# Patient Record
Sex: Male | Born: 1960 | Race: White | Hispanic: No | Smoking: Never smoker
Health system: Southern US, Community
[De-identification: ages and names within clinical notes are randomized; demographics above are authoritative.]

## PROBLEM LIST (undated history)

## (undated) DIAGNOSIS — G43909 Migraine, unspecified, not intractable, without status migrainosus: Secondary | ICD-10-CM

## (undated) DIAGNOSIS — K648 Other hemorrhoids: Secondary | ICD-10-CM

## (undated) DIAGNOSIS — C61 Malignant neoplasm of prostate: Secondary | ICD-10-CM

## (undated) DIAGNOSIS — K317 Polyp of stomach and duodenum: Secondary | ICD-10-CM

## (undated) DIAGNOSIS — K219 Gastro-esophageal reflux disease without esophagitis: Secondary | ICD-10-CM

## (undated) DIAGNOSIS — K5792 Diverticulitis of intestine, part unspecified, without perforation or abscess without bleeding: Secondary | ICD-10-CM

## (undated) DIAGNOSIS — K2270 Barrett's esophagus without dysplasia: Secondary | ICD-10-CM

## (undated) DIAGNOSIS — N200 Calculus of kidney: Secondary | ICD-10-CM

## (undated) DIAGNOSIS — Z1159 Encounter for screening for other viral diseases: Secondary | ICD-10-CM

## (undated) DIAGNOSIS — F529 Unspecified sexual dysfunction not due to a substance or known physiological condition: Secondary | ICD-10-CM

## (undated) DIAGNOSIS — Z87442 Personal history of urinary calculi: Secondary | ICD-10-CM

## (undated) HISTORY — DX: Barrett's esophagus without dysplasia: K22.70

## (undated) HISTORY — DX: Encounter for screening for other viral diseases: Z11.59

## (undated) HISTORY — DX: Polyp of stomach and duodenum: K31.7

## (undated) HISTORY — DX: Diverticulitis of intestine, part unspecified, without perforation or abscess without bleeding: K57.92

## (undated) HISTORY — PX: APPENDECTOMY: SHX54

## (undated) HISTORY — DX: Gastro-esophageal reflux disease without esophagitis: K21.9

## (undated) HISTORY — PX: HERNIA REPAIR: SHX51

## (undated) HISTORY — PX: KIDNEY STONE SURGERY: SHX686

## (undated) HISTORY — PX: PROSTATE BIOPSY: SHX241

## (undated) HISTORY — PX: CHOLECYSTECTOMY: SHX55

## (undated) HISTORY — PX: COLECTOMY: SHX59

## (undated) HISTORY — DX: Migraine, unspecified, not intractable, without status migrainosus: G43.909

## (undated) HISTORY — DX: Unspecified sexual dysfunction not due to a substance or known physiological condition: F52.9

## (undated) HISTORY — DX: Other hemorrhoids: K64.8

---

## 2011-09-27 DIAGNOSIS — K648 Other hemorrhoids: Secondary | ICD-10-CM

## 2011-09-27 HISTORY — DX: Other hemorrhoids: K64.8

## 2012-09-26 DIAGNOSIS — K317 Polyp of stomach and duodenum: Secondary | ICD-10-CM

## 2012-09-26 DIAGNOSIS — K2270 Barrett's esophagus without dysplasia: Secondary | ICD-10-CM

## 2012-09-26 HISTORY — DX: Barrett's esophagus without dysplasia: K22.70

## 2012-09-26 HISTORY — DX: Polyp of stomach and duodenum: K31.7

## 2012-12-13 LAB — HM COLONOSCOPY: HM Colonoscopy: 2003

## 2013-02-13 ENCOUNTER — Other Ambulatory Visit: Payer: Self-pay | Admitting: Family Medicine

## 2013-02-13 ENCOUNTER — Other Ambulatory Visit: Payer: Self-pay | Admitting: *Deleted

## 2013-02-13 LAB — COMPLETE METABOLIC PANEL WITH GFR
ALT: 24 U/L (ref 0–53)
AST: 23 U/L (ref 0–37)
Albumin: 4.3 g/dL (ref 3.5–5.2)
Alkaline Phosphatase: 55 U/L (ref 39–117)
BUN: 11 mg/dL (ref 6–23)
CO2: 27 mEq/L (ref 19–32)
Calcium: 9.4 mg/dL (ref 8.4–10.5)
Chloride: 103 mEq/L (ref 96–112)
Creat: 1.09 mg/dL (ref 0.50–1.35)
GFR, Est African American: 89 mL/min
GFR, Est Non African American: 77 mL/min
Glucose, Bld: 85 mg/dL (ref 70–99)
Potassium: 4.3 mEq/L (ref 3.5–5.3)
Sodium: 140 mEq/L (ref 135–145)
Total Bilirubin: 0.7 mg/dL (ref 0.3–1.2)
Total Protein: 6.7 g/dL (ref 6.0–8.3)

## 2013-02-13 LAB — CBC WITH DIFFERENTIAL/PLATELET
Basophils Absolute: 0 10*3/uL (ref 0.0–0.1)
Basophils Relative: 1 % (ref 0–1)
Eosinophils Absolute: 0.4 10*3/uL (ref 0.0–0.7)
Eosinophils Relative: 6 % — ABNORMAL HIGH (ref 0–5)
HCT: 47.7 % (ref 39.0–52.0)
Hemoglobin: 16.8 g/dL (ref 13.0–17.0)
Lymphocytes Relative: 27 % (ref 12–46)
Lymphs Abs: 1.7 10*3/uL (ref 0.7–4.0)
MCH: 30 pg (ref 26.0–34.0)
MCHC: 35.2 g/dL (ref 30.0–36.0)
MCV: 85.2 fL (ref 78.0–100.0)
Monocytes Absolute: 0.8 10*3/uL (ref 0.1–1.0)
Monocytes Relative: 12 % (ref 3–12)
Neutro Abs: 3.5 10*3/uL (ref 1.7–7.7)
Neutrophils Relative %: 54 % (ref 43–77)
Platelets: 175 10*3/uL (ref 150–400)
RBC: 5.6 MIL/uL (ref 4.22–5.81)
RDW: 13.7 % (ref 11.5–15.5)
WBC: 6.4 10*3/uL (ref 4.0–10.5)

## 2013-02-13 LAB — LIPID PANEL
Cholesterol: 171 mg/dL (ref 0–200)
HDL: 34 mg/dL — ABNORMAL LOW (ref >39)
LDL Cholesterol: 100 mg/dL — ABNORMAL HIGH (ref 0–99)
Total CHOL/HDL Ratio: 5 Ratio
Triglycerides: 185 mg/dL — ABNORMAL HIGH (ref <150)
VLDL: 37 mg/dL (ref 0–40)

## 2013-02-14 LAB — VITAMIN D 25 HYDROXY (VIT D DEFICIENCY, FRACTURES): Vit D, 25-Hydroxy: 32 ng/mL (ref 30–89)

## 2013-02-22 ENCOUNTER — Ambulatory Visit (INDEPENDENT_AMBULATORY_CARE_PROVIDER_SITE_OTHER): Payer: BC Managed Care – PPO | Admitting: Family Medicine

## 2013-02-22 ENCOUNTER — Encounter: Payer: Self-pay | Admitting: *Deleted

## 2013-02-22 ENCOUNTER — Encounter: Payer: Self-pay | Admitting: Family Medicine

## 2013-02-22 VITALS — BP 122/76 | HR 71 | Wt 198.0 lb

## 2013-02-22 DIAGNOSIS — N529 Male erectile dysfunction, unspecified: Secondary | ICD-10-CM

## 2013-02-22 DIAGNOSIS — R5383 Other fatigue: Secondary | ICD-10-CM

## 2013-02-22 DIAGNOSIS — R5381 Other malaise: Secondary | ICD-10-CM

## 2013-02-22 DIAGNOSIS — G43909 Migraine, unspecified, not intractable, without status migrainosus: Secondary | ICD-10-CM

## 2013-02-22 DIAGNOSIS — Z125 Encounter for screening for malignant neoplasm of prostate: Secondary | ICD-10-CM

## 2013-02-22 DIAGNOSIS — K219 Gastro-esophageal reflux disease without esophagitis: Secondary | ICD-10-CM

## 2013-02-22 DIAGNOSIS — E291 Testicular hypofunction: Secondary | ICD-10-CM

## 2013-02-22 DIAGNOSIS — Z1159 Encounter for screening for other viral diseases: Secondary | ICD-10-CM

## 2013-02-22 MED ORDER — RIZATRIPTAN BENZOATE 10 MG PO TBDP: 10.0000 mg | ORAL_TABLET | ORAL | Status: DC | PRN

## 2013-02-22 MED ORDER — SILDENAFIL CITRATE 20 MG PO TABS: ORAL_TABLET | ORAL | Status: DC

## 2013-02-22 MED ORDER — OMEPRAZOLE 40 MG PO CPDR: 40.0000 mg | DELAYED_RELEASE_CAPSULE | Freq: Two times a day (BID) | ORAL | Status: DC

## 2013-02-22 MED ORDER — TADALAFIL 20 MG PO TABS: 20.0000 mg | ORAL_TABLET | Freq: Every day | ORAL | Status: DC | PRN

## 2013-02-26 LAB — PSA, TOTAL AND FREE
PSA, Free Pct: 15 % — ABNORMAL LOW (ref >25)
PSA, Free: 0.43 ng/mL
PSA: 2.94 ng/mL (ref <=4.00)

## 2013-02-26 LAB — TSH: TSH: 1.776 u[IU]/mL (ref 0.350–4.500)

## 2013-02-27 LAB — TESTOSTERONE, FREE, TOTAL, SHBG
Sex Hormone Binding: 20 nmol/L (ref 13–71)
Testosterone, Free: 91 pg/mL (ref 47.0–244.0)
Testosterone-% Free: 2.6 % (ref 1.6–2.9)
Testosterone: 352 ng/dL (ref 300–890)

## 2013-02-27 LAB — VARICELLA ZOSTER ANTIBODY, IGG: Varicella IgG: 2408 Index — ABNORMAL HIGH (ref <135.00)

## 2013-02-27 NOTE — Progress Notes (Signed)
  Subjective:    Patient ID: Phillip Gates, male    DOB: Jul 25, 1961, 52 y.o.   MRN: [id number]  HPI  Phillip Gates is here today to go over his most recent lab results and to discuss the conditions listed below:   1)  Migraine Headaches:  He continues to struggle with this problem.  He has tried Zomig in the past but does not feel that it works that well for him. He would like to try another medication.    2)  Sexual Dysfunction: He needs a refill on his ED medication.   3)  GERD:  He needs to have his omeprazole refilled.     Review of Systems  Constitutional: Positive for fatigue and unexpected weight change. Negative for activity change and appetite change.  HENT: Negative.   Eyes: Negative.   Respiratory: Negative for cough, chest tightness and shortness of breath.   Cardiovascular: Negative for chest pain, palpitations and leg swelling.  Gastrointestinal: Negative for abdominal pain, diarrhea, constipation and blood in stool.  Endocrine: Negative for polydipsia, polyphagia and polyuria.  Genitourinary: Negative for difficulty urinating.  Musculoskeletal: Negative for myalgias, joint swelling and arthralgias.  Skin: Negative for rash.  Neurological: Negative for dizziness, numbness and headaches.  Hematological: Negative.   Psychiatric/Behavioral: Negative.      Past Medical History  Diagnosis Date  . Migraine headache   . GERD (gastroesophageal reflux disease)   . Diverticulitis     Family History  Problem Relation Age of Onset  . Heart disease Father   . CVA Father   . Cancer Brother     History   Social History Narrative   Marital Status: Separated    Children: Son Phillip Gates) Daughter Phillip Gates)     Pets: None   Living Situation: Lives alone   Occupation: Product manager (Journalist, newspaper)    Education: Oncologist (Business)    Tobacco Use/Exposure:  None    Alcohol Use:  Ocassional    Drug Use:  None   Diet:  Regular   Exercise:  Weight Lifting/Cardio (5-6 X  per week)    Hobbies: Traveling                     Objective:   Physical Exam  Constitutional: He appears well-nourished.  HENT:  Head: Normocephalic.  Nose: Nose normal.  Mouth/Throat: Oropharynx is clear and moist.  Eyes: Conjunctivae are normal. No scleral icterus.  Neck: Neck supple. No thyromegaly present.  Cardiovascular: Normal rate, regular rhythm and normal heart sounds.   Pulmonary/Chest: Effort normal and breath sounds normal.  Abdominal: Soft. He exhibits no mass. There is no tenderness.  Musculoskeletal: Normal range of motion.  Lymphadenopathy:    He has no cervical adenopathy.  Neurological: He is alert.  Skin: Skin is warm and dry. No rash noted.  Psychiatric: He has a normal mood and affect. His behavior is normal. Judgment and thought content normal.          Assessment & Plan:

## 2013-03-01 ENCOUNTER — Encounter: Payer: Self-pay | Admitting: Family Medicine

## 2013-03-05 ENCOUNTER — Encounter: Payer: BC Managed Care – PPO | Admitting: Family Medicine

## 2013-03-05 ENCOUNTER — Encounter: Payer: Self-pay | Admitting: Family Medicine

## 2013-03-05 ENCOUNTER — Ambulatory Visit (INDEPENDENT_AMBULATORY_CARE_PROVIDER_SITE_OTHER): Payer: BC Managed Care – PPO | Admitting: Family Medicine

## 2013-03-05 VITALS — BP 143/85 | HR 67 | Ht 70.0 in | Wt 203.0 lb

## 2013-03-05 DIAGNOSIS — B351 Tinea unguium: Secondary | ICD-10-CM

## 2013-03-05 DIAGNOSIS — Z0000 Encounter for general adult medical examination without abnormal findings: Secondary | ICD-10-CM

## 2013-03-05 LAB — POCT URINALYSIS DIPSTICK
Bilirubin, UA: NEGATIVE
Blood, UA: NEGATIVE
Glucose, UA: NEGATIVE
Ketones, UA: NEGATIVE
Leukocytes, UA: NEGATIVE
Nitrite, UA: NEGATIVE
Protein, UA: NEGATIVE
Spec Grav, UA: 1.02
Urobilinogen, UA: 2
pH, UA: 5

## 2013-03-05 MED ORDER — TERBINAFINE HCL 250 MG PO TABS: 250.0000 mg | ORAL_TABLET | Freq: Every day | ORAL | Status: DC

## 2013-03-05 NOTE — Patient Instructions (Addendum)
Preventive Care for Adults, Male  A healthy lifestyle and preventive care can promote health and wellness. Preventive health guidelines for men include the following key practices:  · A routine yearly physical is a good way to check with your caregiver about your health and preventative screening. It is a chance to share any concerns and updates on your health, and to receive a thorough exam.  · Visit your dentist for a routine exam and preventative care every 6 months. Brush your teeth twice a day and floss once a day. Good oral hygiene prevents tooth decay and gum disease.  · The frequency of eye exams is based on your age, health, family medical history, use of contact lenses, and other factors. Follow your caregiver's recommendations for frequency of eye exams.  · Eat a healthy diet. Foods like vegetables, fruits, whole grains, low-fat dairy products, and lean protein foods contain the nutrients you need without too many calories. Decrease your intake of foods high in solid fats, added sugars, and salt. Eat the right amount of calories for you. Get information about a proper diet from your caregiver, if necessary.  · Regular physical exercise is one of the most important things you can do for your health. Most adults should get at least 150 minutes of moderate-intensity exercise (any activity that increases your heart rate and causes you to sweat) each week. In addition, most adults need muscle-strengthening exercises on 2 or more days a week.  · Maintain a healthy weight. The body mass index (BMI) is a screening tool to identify possible weight problems. It provides an estimate of body fat based on height and weight. Your caregiver can help determine your BMI, and can help you achieve or maintain a healthy weight. For adults 20 years and older:  · A BMI below 18.5 is considered underweight.  · A BMI of 18.5 to 24.9 is normal.  · A BMI of 25 to 29.9 is considered overweight.  · A BMI of 30 and above is  considered obese.  · Maintain normal blood lipids and cholesterol levels by exercising and minimizing your intake of saturated fat. Eat a balanced diet with plenty of fruit and vegetables. Blood tests for lipids and cholesterol should begin at age 20 and be repeated every 5 years. If your lipid or cholesterol levels are high, you are over 50, or you are a high risk for heart disease, you may need your cholesterol levels checked more frequently. Ongoing high lipid and cholesterol levels should be treated with medicines if diet and exercise are not effective.  · If you smoke, find out from your caregiver how to quit. If you do not use tobacco, do not start.  · If you choose to drink alcohol, do not exceed 2 drinks per day. One drink is considered to be 12 ounces (355 mL) of beer, 5 ounces (148 mL) of wine, or 1.5 ounces (44 mL) of liquor.  · Avoid use of street drugs. Do not share needles with anyone. Ask for help if you need support or instructions about stopping the use of drugs.  · High blood pressure causes heart disease and increases the risk of stroke. Your blood pressure should be checked at least every 1 to 2 years. Ongoing high blood pressure should be treated with medicines, if weight loss and exercise are not effective.  · If you are 45 to 53 years old, ask your caregiver if you should take aspirin to prevent heart disease.  · Diabetes screening involves taking   a blood sample to check your fasting blood sugar level. This should be done once every 3 years, after age 45, if you are within normal weight and without risk factors for diabetes. Testing should be considered at a younger age or be carried out more frequently if you are overweight and have at least 1 risk factor for diabetes.  · Colorectal cancer can be detected and often prevented. Most routine colorectal cancer screening begins at the age of 50 and continues through age 75. However, your caregiver may recommend screening at an earlier age if you  have risk factors for colon cancer. On a yearly basis, your caregiver may provide home test kits to check for hidden blood in the stool. Use of a small camera at the end of a tube, to directly examine the colon (sigmoidoscopy or colonoscopy), can detect the earliest forms of colorectal cancer. Talk to your caregiver about this at age 50, when routine screening begins.  Direct examination of the colon should be repeated every 5 to 10 years through age 75, unless early forms of pre-cancerous polyps or small growths are found.  · Hepatitis C blood testing is recommended for all people born from 1945 through 1965 and any individual with known risks for hepatitis C.  · Practice safe sex. Use condoms and avoid high-risk sexual practices to reduce the spread of sexually transmitted infections (STIs). STIs include gonorrhea, chlamydia, syphilis, trichomonas, herpes, HPV, and human immunodeficiency virus (HIV). Herpes, HIV, and HPV are viral illnesses that have no cure. They can result in disability, cancer, and death.  · A one-time screening for abdominal aortic aneurysm (AAA) and surgical repair of large AAAs by sound wave imaging (ultrasonography) is recommended for ages 65 to 75 years who are current or former smokers.  · Healthy men should no longer receive prostate-specific antigen (PSA) blood tests as part of routine cancer screening. Consult with your caregiver about prostate cancer screening.  · Testicular cancer screening is not recommended for adult males who have no symptoms. Screening includes self-exam, caregiver exam, and other screening tests. Consult with your caregiver about any symptoms you have or any concerns you have about testicular cancer.  · Use sunscreen with skin protection factor (SPF) of 30 or more. Apply sunscreen liberally and repeatedly throughout the day. You should seek shade when your shadow is shorter than you. Protect yourself by wearing long sleeves, pants, a wide-brimmed hat, and  sunglasses year round, whenever you are outdoors.  · Once a month, do a whole body skin exam, using a mirror to look at the skin on your back. Notify your caregiver of new moles, moles that have irregular borders, moles that are larger than a pencil eraser, or moles that have changed in shape or color.  · Stay current with required immunizations.  · Influenza. You need a dose every fall (or winter). The composition of the flu vaccine changes each year, so being vaccinated once is not enough.  · Pneumococcal polysaccharide. You need 1 to 2 doses if you smoke cigarettes or if you have certain chronic medical conditions. You need 1 dose at age 65 (or older) if you have never been vaccinated.  · Tetanus, diphtheria, pertussis (Tdap, Td). Get 1 dose of Tdap vaccine if you are younger than age 65 years, are over 65 and have contact with an infant, are a healthcare worker, or simply want to be protected from whooping cough. After that, you need a Td booster dose every 10 years. Consult your   caregiver if you have not had at least 3 tetanus and diphtheria-containing shots sometime in your life or have a deep or dirty wound.  · HPV. This vaccine is recommended for males 13 through 53 years of age. This vaccine may be given to men 22 through 53 years of age who have not completed the 3 dose series. It is recommended for men through age 26 who have sex with men or whose immune system is weakened because of HIV infection, other illness, or medications. The vaccine is given in 3 doses over 6 months.  · Measles, mumps, rubella (MMR). You need at least 1 dose of MMR if you were born in 1957 or later. You may also need a 2nd dose.  · Meningococcal. If you are age 19 to 21 years and a first-year college student living in a residence hall, or have one of several medical conditions, you need to get vaccinated against meningococcal disease. You may also need additional booster doses.  · Zoster (shingles). If you are age 60 years or  older, you should get this vaccine.  · Varicella (chickenpox). If you have never had chickenpox or you were vaccinated but received only 1 dose, talk to your caregiver to find out if you need this vaccine.  · Hepatitis A. You need this vaccine if you have a specific risk factor for hepatitis A virus infection, or you simply wish to be protected from this disease. The vaccine is usually given as 2 doses, 6 to 18 months apart.  · Hepatitis B. You need this vaccine if you have a specific risk factor for hepatitis B virus infection or you simply wish to be protected from this disease. The vaccine is given in 3 doses, usually over 6 months.  Preventative Service / Frequency  Ages 19 to 39  · Blood pressure check.** / Every 1 to 2 years.  · Lipid and cholesterol check.** / Every 5 years beginning at age 20.  · Hepatitis C blood test.** / For any individual with known risks for hepatitis C.  · Skin self-exam. / Monthly.  · Influenza immunization.** / Every year.  · Pneumococcal polysaccharide immunization.** / 1 to 2 doses if you smoke cigarettes or if you have certain chronic medical conditions.  · Tetanus, diphtheria, pertussis (Tdap,Td) immunization. / A one-time dose of Tdap vaccine. After that, you need a Td booster dose every 10 years.  · HPV immunization. / 3 doses over 6 months, if 26 and younger.  · Measles, mumps, rubella (MMR) immunization. / You need at least 1 dose of MMR if you were born in 1957 or later. You may also need a 2nd dose.  · Meningococcal immunization. / 1 dose if you are age 19 to 21 years and a first-year college student living in a residence hall, or have one of several medical conditions, you need to get vaccinated against meningococcal disease. You may also need additional booster doses.  · Varicella immunization.** / Consult your caregiver.  · Hepatitis A immunization.** / Consult your caregiver. 2 doses, 6 to 18 months apart.  · Hepatitis B immunization.** / Consult your caregiver. 3 doses  usually over 6 months.  Ages 40 to 64  · Blood pressure check.** / Every 1 to 2 years.  · Lipid and cholesterol check.** / Every 5 years beginning at age 20.  · Fecal occult blood test (FOBT) of stool. / Every year beginning at age 50 and continuing until age 75. You may not have   to do this test if you get colonoscopy every 10 years.  · Flexible sigmoidoscopy** or colonoscopy.** / Every 5 years for a flexible sigmoidoscopy or every 10 years for a colonoscopy beginning at age 50 and continuing until age 75.  · Hepatitis C blood test.** / For all people born from 1945 through 1965 and any individual with known risks for hepatitis C.  · Skin self-exam. / Monthly.  · Influenza immunization.** / Every year.  · Pneumococcal polysaccharide immunization.** / 1 to 2 doses if you smoke cigarettes or if you have certain chronic medical conditions.  · Tetanus, diphtheria, pertussis (Tdap/Td) immunization.** / A one-time dose of Tdap vaccine. After that, you need a Td booster dose every 10 years.  · Measles, mumps, rubella (MMR) immunization.  / You need at least 1 dose of MMR if you were born in 1957 or later. You may also need a 2nd dose.  · Varicella immunization.**/ Consult your caregiver.  · Meningococcal immunization.** / Consult your caregiver.  · Hepatitis A immunization.** / Consult your caregiver. 2 doses, 6 to 18 months apart.  · Hepatitis B immunization.** / Consult your caregiver. 3 doses, usually over 6 months.  Ages 65 and over  · Blood pressure check.** / Every 1 to 2 years.  · Lipid and cholesterol check.**/ Every 5 years beginning at age 20.  · Fecal occult blood test (FOBT) of stool. / Every year beginning at age 50 and continuing until age 75. You may not have to do this test if you get colonoscopy every 10 years.  · Flexible sigmoidoscopy** or colonoscopy.** / Every 5 years for a flexible sigmoidoscopy or every 10 years for a colonoscopy beginning at age 50 and continuing until age 75.  · Hepatitis C blood  test.** / For all people born from 1945 through 1965 and any individual with known risks for hepatitis C.  · Abdominal aortic aneurysm (AAA) screening.** / A one-time screening for ages 65 to 75 years who are current or former smokers.  · Skin self-exam. / Monthly.  · Influenza immunization.** / Every year.  · Pneumococcal polysaccharide immunization.** / 1 dose at age 65 (or older) if you have never been vaccinated.  · Tetanus, diphtheria, pertussis (Tdap, Td) immunization. / A one-time dose of Tdap vaccine if you are over 65 and have contact with an infant, are a healthcare worker, or simply want to be protected from whooping cough. After that, you need a Td booster dose every 10 years.  · Varicella immunization. ** / Consult your caregiver.  · Meningococcal immunization.** / Consult your caregiver.  · Hepatitis A immunization. ** / Consult your caregiver. 2 doses, 6 to 18 months apart.  · Hepatitis B immunization.** / Check with your caregiver. 3 doses, usually over 6 months.  **Family history and personal history of risk and conditions may change your caregiver's recommendations.  Document Released: 11/08/2001 Document Revised: 12/05/2011 Document Reviewed: 02/07/2011  [employer]® Patient Information ©2014 [employer].

## 2013-03-05 NOTE — Progress Notes (Deleted)
  Subjective:    Patient ID: Phillip Gates, male    DOB: December 26, 1960, 52 y.o.   MRN: [id number]  HPI    Review of Systems     Objective:   Physical Exam        Assessment & Plan:

## 2013-03-05 NOTE — Progress Notes (Signed)
  Subjective:    Patient ID: Phillip Gates, male    DOB: 06-30-1961, 52 y.o.   MRN: [id number]  HPI  Diangelo is here today for his annual CPE.  He has done well since his last office visit.     Review of Systems  Constitutional: Negative.   HENT: Negative.   Eyes: Negative.   Respiratory: Negative.   Cardiovascular: Negative.   Gastrointestinal: Negative for abdominal pain, diarrhea, constipation and blood in stool.  Endocrine: Negative.   Genitourinary: Negative.   Musculoskeletal: Negative.   Skin: Negative.   Allergic/Immunologic: Negative.   Neurological: Negative.   Hematological: Negative.   Psychiatric/Behavioral: Negative.     Past Medical History  Diagnosis Date  . Migraine headache   . GERD (gastroesophageal reflux disease)   . Diverticulitis   . Esophageal reflux   . Problems with sexual function   . Special screening examination for unspecified viral disease     Family History  Problem Relation Age of Onset  . Heart disease Father   . CVA Father   . Cancer Brother     History   Social History Narrative   Marital Status: Separated    Children: Son Dubin) Daughter Benetta Spar)     Pets: None   Living Situation: Lives alone   Occupation: Product manager (Journalist, newspaper)    Education: Oncologist (Business)    Tobacco Use/Exposure:  None    Alcohol Use:  Ocassional    Drug Use:  None   Diet:  Regular   Exercise:  Weight Lifting/Cardio (5-6 X per week)    Hobbies: Traveling                     Objective:   Physical Exam  Constitutional: He is oriented to person, place, and time. He appears well-developed and well-nourished. No distress.  HENT:  Nose: Nose normal.  Mouth/Throat: No oropharyngeal exudate.  Eyes: Conjunctivae are normal. No scleral icterus.  Neck: Normal range of motion. Neck supple. No thyromegaly present.  Cardiovascular: Normal rate, regular rhythm and normal heart sounds.   No murmur heard. Pulmonary/Chest: Effort  normal and breath sounds normal.  Abdominal: Soft. Bowel sounds are normal. He exhibits no mass. There is no tenderness. Hernia confirmed negative in the right inguinal area and confirmed negative in the left inguinal area.  Genitourinary: Testes normal and penis normal. Circumcised.  Musculoskeletal: Normal range of motion. He exhibits no edema and no tenderness.  Lymphadenopathy:    He has no cervical adenopathy.       Right: No inguinal adenopathy present.       Left: No inguinal adenopathy present.  Neurological: He is alert and oriented to person, place, and time.  Skin: No rash noted.  Toe Nail Fungus   Psychiatric: He has a normal mood and affect. His behavior is normal. Judgment and thought content normal.          Assessment & Plan:

## 2013-03-17 DIAGNOSIS — G43909 Migraine, unspecified, not intractable, without status migrainosus: Secondary | ICD-10-CM | POA: Insufficient documentation

## 2013-03-17 DIAGNOSIS — N529 Male erectile dysfunction, unspecified: Secondary | ICD-10-CM | POA: Insufficient documentation

## 2013-03-17 DIAGNOSIS — K219 Gastro-esophageal reflux disease without esophagitis: Secondary | ICD-10-CM | POA: Insufficient documentation

## 2013-03-17 NOTE — Assessment & Plan Note (Signed)
He was given a prescription for Maxalt.

## 2013-03-17 NOTE — Assessment & Plan Note (Signed)
Refilled his omeprazole.   

## 2013-03-17 NOTE — Assessment & Plan Note (Addendum)
Refilled his Cialis and he was given a prescription to get sildenafil at [facility].

## 2013-03-30 DIAGNOSIS — Z0000 Encounter for general adult medical examination without abnormal findings: Secondary | ICD-10-CM | POA: Insufficient documentation

## 2013-03-30 DIAGNOSIS — B351 Tinea unguium: Secondary | ICD-10-CM | POA: Insufficient documentation

## 2013-03-30 NOTE — Assessment & Plan Note (Signed)
He was given a prescription for Lamisil.

## 2013-03-30 NOTE — Assessment & Plan Note (Signed)
Normal exam.

## 2013-12-17 ENCOUNTER — Encounter (INDEPENDENT_AMBULATORY_CARE_PROVIDER_SITE_OTHER): Payer: Self-pay

## 2013-12-17 ENCOUNTER — Encounter: Payer: Self-pay | Admitting: Family Medicine

## 2013-12-17 ENCOUNTER — Ambulatory Visit (INDEPENDENT_AMBULATORY_CARE_PROVIDER_SITE_OTHER): Payer: BC Managed Care – PPO | Admitting: Family Medicine

## 2013-12-17 VITALS — BP 132/78 | HR 69 | Resp 16 | Ht 70.0 in | Wt 195.0 lb

## 2013-12-17 DIAGNOSIS — N529 Male erectile dysfunction, unspecified: Secondary | ICD-10-CM

## 2013-12-17 DIAGNOSIS — B351 Tinea unguium: Secondary | ICD-10-CM

## 2013-12-17 DIAGNOSIS — K219 Gastro-esophageal reflux disease without esophagitis: Secondary | ICD-10-CM

## 2013-12-17 MED ORDER — TERBINAFINE HCL 250 MG PO TABS: 250.0000 mg | ORAL_TABLET | Freq: Every day | ORAL | Status: AC

## 2013-12-17 MED ORDER — OMEPRAZOLE 40 MG PO CPDR: 40.0000 mg | DELAYED_RELEASE_CAPSULE | Freq: Two times a day (BID) | ORAL | Status: DC

## 2013-12-17 MED ORDER — OMEPRAZOLE 40 MG PO CPDR
40.0000 mg | DELAYED_RELEASE_CAPSULE | Freq: Two times a day (BID) | ORAL | Status: DC
Start: 2013-12-17 — End: 2013-12-17

## 2013-12-17 MED ORDER — SILDENAFIL CITRATE 20 MG PO TABS: ORAL_TABLET | ORAL | Status: DC

## 2013-12-17 NOTE — Progress Notes (Signed)
Subjective:    Patient ID: Phillip Gates, male    DOB: Mar 21, 1961, 53 y.o.   MRN: [id number]  HPI  Phillip Gates is here today to get medication refills. He is doing well and has no medical complaints.  1)  GERD:  His symptoms are controlled with the omeprazole. He is needing a refill.  2)  ED:  He is taking Viagra and Cialis as needed. He would like to have a refill.  3)  Toe Nail Fungus:  He would like to get one more refill of Lamsil.    Review of Systems  Constitutional: Negative for activity change, appetite change, fatigue and unexpected weight change.  HENT: Negative.   Respiratory: Negative for shortness of breath.   Cardiovascular: Negative for chest pain, palpitations and leg swelling.  Gastrointestinal: Negative.   Genitourinary: Negative.   Musculoskeletal: Negative for myalgias.  Skin: Negative.        Left middle small toe has some fungus  Neurological: Negative.   Psychiatric/Behavioral: Negative.   All other systems reviewed and are negative.    Past Medical History  Diagnosis Date  . Migraine headache   . GERD (gastroesophageal reflux disease)   . Diverticulitis   . Esophageal reflux   . Problems with sexual function   . Special screening examination for unspecified viral disease   . Barrett esophagus 2014     Dr. Bryn Gulling - F/U in 3 years   . Gastric polyps 2014  . Internal hemorrhoids 2013     Dr. Bryn Gulling - F/U in 10 years      Past Surgical History  Procedure Laterality Date  . Cholecystectomy    . Colectomy    . Hernia repair       History   Social History Narrative   Marital Status: Divorced    Children: Son Phillip Gates) Daughter Phillip Gates)     Pets: None   Living Situation: Lives alone   Occupation: Cabin crew (Psychologist, prison and probation services)    Education: Dietitian (Business)    Tobacco Use/Exposure:  None    Alcohol Use:  Ocassional; < 1 drink per week    Drug Use:  None   Diet:  Regular   Exercise:  Weight Lifting/Cardio (5-6 X per  week)    Hobbies: Traveling                       Family History  Problem Relation Age of Onset  . Heart disease Father     Pacemaker  . CVA Father   . Cancer Brother      Current Outpatient Prescriptions on File Prior to Visit  Medication Sig Dispense Refill  . sildenafil (VIAGRA) 100 MG tablet Take 100 mg by mouth daily as needed for erectile dysfunction.      . tadalafil (CIALIS) 20 MG tablet Take 1 tablet (20 mg total) by mouth daily as needed for erectile dysfunction.  4 tablet  11   No current facility-administered medications on file prior to visit.     No Known Allergies   Immunization History  Administered Date(s) Administered  . Influenza-Unspecified 06/26/2013       Objective:   Physical Exam  Nursing note and vitals reviewed. Constitutional: He appears well-nourished.  HENT:  Head: Normocephalic.  Eyes: Pupils are equal, round, and reactive to light.  Neck: Normal range of motion.  Cardiovascular: Normal rate.   Musculoskeletal: Normal range of motion.  Neurological: He is alert.  Skin: Skin is warm and  dry.  Psychiatric: He has a normal mood and affect. His behavior is normal. Judgment and thought content normal.       Assessment & Plan:    Phillip Gates was seen today for medication management.  Diagnoses and associated orders for this visit:  GERD (gastroesophageal reflux disease) - omeprazole (PRILOSEC) 40 MG capsule; Take 1 capsule (40 mg total) by mouth 2 (two) times daily.  Dermatophytosis of nail - terbinafine (LAMISIL) 250 MG tablet; Take 1 tablet (250 mg total) by mouth daily.  Erectile dysfunction - sildenafil (REVATIO) 20 MG tablet; Take 2-5 tablets as needed for sexual activity  TIME SPENT "FACE TO FACE" WITH PATIENT -  30 MINS

## 2018-11-07 ENCOUNTER — Other Ambulatory Visit: Payer: Self-pay | Admitting: Urology

## 2018-11-07 DIAGNOSIS — C61 Malignant neoplasm of prostate: Secondary | ICD-10-CM

## 2019-01-07 ENCOUNTER — Other Ambulatory Visit: Payer: Self-pay

## 2019-01-07 ENCOUNTER — Ambulatory Visit
Admission: RE | Admit: 2019-01-07 | Discharge: 2019-01-07 | Disposition: A | Discharge status: Home / Self Care | Payer: BLUE CROSS/BLUE SHIELD | Source: Ambulatory Visit | Attending: Urology | Admitting: Urology

## 2019-01-07 DIAGNOSIS — C61 Malignant neoplasm of prostate: Secondary | ICD-10-CM

## 2019-01-07 MED ORDER — GADOBENATE DIMEGLUMINE 529 MG/ML IV SOLN
20.0000 mL | Freq: Once | INTRAVENOUS | Status: AC | PRN
  Administered 2019-01-07: 20 mL via INTRAVENOUS

## 2019-02-25 ENCOUNTER — Encounter: Payer: Self-pay | Admitting: Radiation Oncology

## 2019-02-25 NOTE — Progress Notes (Signed)
GU Location of Tumor / Histology: prostatic adenocarcinoma  If Prostate Cancer, Gleason Score is (3 + 3) and PSA is (6.28) at diagnosis. Prostate volume: 41 cc.   Repeat biopsy: 3+4 and PSA is (3.30). Prostate volume: 32.8 grams.   Phillip Gates had a PSA of 4.94 in 04/2018 prompting a prostate biopsy. Biopsy revealed that 5/12 cores were positive for gleason 3+3 adenocarcinoma. Patient opted for active surveillance. MR/US fusion biopsy done 12/2018 endorsed progression with a Gleason of 3+4. Patient leaning toward surgery but desire to hear radiation options first as advised by Dr. Alinda Money.  Biopsies of prostate (if applicable) revealed:    Past/Anticipated interventions by urology, if any: prostate biopsy, MR/US fusion biopsy, referral to Dr. Tammi Klippel to discuss radiotherapy options.   Past/Anticipated interventions by medical oncology, if any: no  Weight changes, if any: no  Bowel/Bladder complaints, if any: IPSS 2. SHIM 25 with aid of Cialis and Viagra. Denies dysuria or hematuria. Denies urinary leakage or incontinence. Denies any bowel complaints.  Nausea/Vomiting, if any: no  Pain issues, if any:  denies  SAFETY ISSUES:  Prior radiation? Denies  Pacemaker/ICD? Denies  Possible current pregnancy? no, male patient  Is the patient on methotrexate? no  Current Complaints / other details:  58 year old male. Divorced with one son and one daughter. Brother diagnosed with prostate ca at age 48. NKDA. 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 Has a history of abdominal surgery.

## 2019-02-26 ENCOUNTER — Encounter: Payer: Self-pay | Admitting: Radiation Oncology

## 2019-02-26 ENCOUNTER — Ambulatory Visit
Admission: RE | Admit: 2019-02-26 | Discharge: 2019-02-26 | Disposition: A | Discharge status: Home / Self Care | Payer: BLUE CROSS/BLUE SHIELD | Source: Ambulatory Visit | Attending: Radiation Oncology | Admitting: Radiation Oncology

## 2019-02-26 ENCOUNTER — Other Ambulatory Visit: Payer: Self-pay

## 2019-02-26 VITALS — Ht 70.0 in | Wt 210.0 lb

## 2019-02-26 DIAGNOSIS — C61 Malignant neoplasm of prostate: Secondary | ICD-10-CM

## 2019-02-26 HISTORY — DX: Malignant neoplasm of prostate: C61

## 2019-02-26 NOTE — Progress Notes (Signed)
Radiation Oncology         [phone] ________________________________  Initial Outpatient Consultation - Conducted via WebEx due to current COVID-19 concerns for limiting patient exposure  Name: Phillip Gates MRN: [id number]  Date: 02/26/2019  DOB: [birth year]  XB:[id number], Bernadene Bell, MD  Raynelle Bring, MD   REFERRING PHYSICIAN: Raynelle Bring, MD  DIAGNOSIS: 58 y.o. 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    ICD-10-CM   1. Malignant neoplasm of prostate ([place]) C61     HISTORY OF PRESENT ILLNESS: Phillip Gates is a 58 y.o. male with a diagnosis of prostate cancer. He was initially diagnosed with Gleason 3+3, adenocarcinoma of the prostate on 05/18/2018 with an elevated PSA of 4.94 with Dr. Thomasene Mohair in [facility].  He was referred for second opinion with Dr. Alinda Money on 07/10/2018, and digital rectal examination was performed at that time revealing no prostate nodules.  After reviewing options including active surveillance versus therapies of curative intent, the patient elected to proceed with initial active surveillance. An MRI of the prostate was performed on 01/07/2019 as part of his surveillance and showed a 1.9 cm nodule in the left anterior apex, involving the peripheral and transition zones and fibromuscular stroma, highly suspicious for high-grade carcinoma (PI-RADS 5). There was no evidence of extracapsular extension or pelvic metastatic disease. Repeat PSA was 3.30 on 01/11/2019. He returned for follow-up with Dr. Alinda Money on 01/18/2019, and repeat digital rectal examination was performed at that time revealing no prostate nodules.    The patient proceeded to MR fusion biopsy with 28 total biopsies of the prostate on 01/24/2019.  The prostate volume measured 32.8 cc.  Gleason 3+4 prostatic adenocarcinoma was identified in all 4 samples taken from the targeted MRI ROI lesion. Out of 24 core biopsies, 11 were positive.  The maximum Gleason  score was 3+4, and this was seen in the left base lateral, left base, left mid, and right apex (2 cores). Additionally, Gleason 3+3 disease was seen in the left mid lateral, left base, left apex (2 cores), right mid, and right mid lateral (2 cores).   Biopsies of prostate from 01/24/2019 revealed:    The patient reviewed the biopsy results with his urologist and he has kindly been referred today for discussion of potential radiation treatment options.  PREVIOUS RADIATION THERAPY: No  PAST MEDICAL HISTORY:  Past Medical History:  Diagnosis Date   Barrett esophagus 2014    Dr. Bryn Gulling - F/U in 3 years    Diverticulitis    Esophageal reflux    Gastric polyps 2014   GERD (gastroesophageal reflux disease)    Internal hemorrhoids 2013    Dr. Bryn Gulling - F/U in 10 years    Migraine headache    Problems with sexual function    Prostate cancer [facility])    Special screening examination for unspecified viral disease       PAST SURGICAL HISTORY: Past Surgical History:  Procedure Laterality Date   APPENDECTOMY     CHOLECYSTECTOMY     COLECTOMY     HERNIA REPAIR     PROSTATE BIOPSY      FAMILY HISTORY:  Family History  Problem Relation Age of Onset   Heart disease Father        Pacemaker   CVA Father    Prostate cancer Brother    Breast cancer Neg Hx    Colon cancer Neg Hx     SOCIAL HISTORY:  Social  History   Socioeconomic History   Marital status: Divorced    Spouse name: Not on file   Number of children: 2   Years of education: 16   Highest education level: Not on file  Occupational History   Occupation: QUALITY CONTROL     Employer: OTHER    Comment: [employer], fashion/clothing strain: Not on file   Food insecurity:    Worry: Not on file    Inability: Not on file   Transportation needs:    Medical: Not on file    Non-medical: Not on file  Tobacco Use   Smoking status: Never Smoker   Smokeless tobacco:  Never Used  Substance and Sexual Activity   Alcohol use: Yes    Alcohol/week: 0.5 standard drinks    Types: 1 Standard drinks or equivalent per week    Comment: He drinks occasionally (< 1 drink per week)    Drug use: No   Sexual activity: Yes    Partners: Female  Lifestyle   Physical activity:    Days per week: Not on file    Minutes per session: Not on file   Stress: Not on file  Relationships   Social connections:    Talks on phone: Not on file    Gets together: Not on file    Attends religious service: Not on file    Active member of club or organization: Not on file    Attends meetings of clubs or organizations: Not on file    Relationship status: Not on file   Intimate partner violence:    Fear of current or ex partner: Not on file    Emotionally abused: Not on file    Physically abused: Not on file    Forced sexual activity: Not on file  Other Topics Concern   Not on file  Social History Narrative   Marital Status: Divorced    Children: Son Leifheit) Daughter Jordan Hawks)     Pets: None   Living Situation: Lives alone   Occupation: Cabin crew (Psychologist, prison and probation services)    Education: Dietitian (Business)    Tobacco Use/Exposure:  None    Alcohol Use:  Ocassional; < 1 drink per week    Drug Use:  None   Diet:  Regular   Exercise:  Weight Lifting/Cardio (5-6 X per week)    Hobbies: Traveling                      ALLERGIES: Patient has no known allergies.  MEDICATIONS:  Current Outpatient Medications  Medication Sig Dispense Refill   atorvastatin (LIPITOR) 40 MG tablet Take by mouth.     fluticasone (FLONASE) 50 MCG/ACT nasal spray 2 sprays by Each Nare route daily.     omeprazole (PRILOSEC) 40 MG capsule Take 1 capsule (40 mg total) by mouth 2 (two) times daily. 60 capsule 11   sildenafil (VIAGRA) 100 MG tablet Take 100 mg by mouth daily as needed for erectile dysfunction.     tadalafil (CIALIS) 10 MG tablet Take 10 mg by mouth daily as  needed for erectile dysfunction. Reports alternating Viagra and Cialis     No current facility-administered medications for this encounter.     REVIEW OF SYSTEMS:  On review of systems, the patient reports that he is doing well overall. He denies any chest pain, shortness of breath, cough, fevers, chills, night sweats, or unintended weight changes. He denies any bowel disturbances, and  denies abdominal pain, nausea or vomiting. He denies any new musculoskeletal or joint aches or pains. His IPSS was 2, indicating mild urinary symptoms. He denies dysuria, hematuria, leakage or incontinence. His SHIM was 25, indicating he does not have erectile dysfunction, with aid of Cialis and Viagra. A complete review of systems is obtained and is otherwise negative.    PHYSICAL EXAM:  Wt Readings from Last 3 Encounters:  02/26/19 210 lb (95.3 kg)  12/17/13 195 lb (88.5 kg)  03/05/13 203 lb (92.1 kg)   Temp Readings from Last 3 Encounters:  No data found for Temp   BP Readings from Last 3 Encounters:  12/17/13 132/78  03/05/13 (!) 143/85  02/22/13 122/76   Pulse Readings from Last 3 Encounters:  12/17/13 69  03/05/13 67  02/22/13 71   Pain Assessment Pain Score: 0-No pain/10  In general this is a well appearing caucasian male in no acute distress. He is alert and oriented x4 and appropriate throughout the examination. Cardiopulmonary assessment is negative for acute distress and he exhibits normal effort. Remainder of exam not performed in light of virtual consultation platform.  KPS = 100  100 - Normal; no complaints; no evidence of disease. 90   - Able to carry on normal activity; minor signs or symptoms of disease. 80   - Normal activity with effort; some signs or symptoms of disease. 14   - Cares for self; unable to carry on normal activity or to do active work. 60   - Requires occasional assistance, but is able to care for most of his personal needs. 50   - Requires considerable  assistance and frequent medical care. 83   - Disabled; requires special care and assistance. 74   - Severely disabled; hospital admission is indicated although death not imminent. 57   - Very sick; hospital admission necessary; active supportive treatment necessary. 10   - Moribund; fatal processes progressing rapidly. 0     - Dead  Karnofsky DA, Abelmann Mulberry, Craver LS and Burchenal [facility] [phone]) The use of the nitrogen mustards in the palliative treatment of carcinoma: with particular reference to bronchogenic carcinoma Cancer 1 [id number]  LABORATORY DATA:  Lab Results  Component Value Date   WBC 6.4 02/13/2013   HGB 16.8 02/13/2013   HCT 47.7 02/13/2013   MCV 85.2 02/13/2013   PLT 175 02/13/2013   Lab Results  Component Value Date   NA 140 02/13/2013   K 4.3 02/13/2013   CL 103 02/13/2013   CO2 27 02/13/2013   Lab Results  Component Value Date   ALT 24 02/13/2013   AST 23 02/13/2013   ALKPHOS 55 02/13/2013   BILITOT 0.7 02/13/2013     RADIOGRAPHY: No results found.    IMPRESSION/PLAN: 1. 58 y.o. gentleman with Stage T1c adenocarcinoma of the prostate with Gleason Score of 3+4, and PSA of 3.30. We discussed the patient's workup and outlined the nature of prostate cancer in this setting. The patient's T stage, Gleason's score, and PSA put him into the favorable-intermeidate risk group. Accordingly, he is eligible for a variety of potential treatment options including radical prostatectomy, prostate brachytherapy, or 5.5 weeks of external radiation. We discussed the available radiation techniques, and focused on the details and logistics and delivery. We discussed and outlined the risks, benefits, short and long-term effects associated with radiotherapy and compared and contrasted these with prostatectomy.  The patient focused most of his questions and interest in robotic-assisted laparoscopic radical prostatectomy.  We discussed  some of the potential advantages of surgery including  surgical staging, the availability of salvage radiotherapy to the prostatic fossa, and the confidence associated with immediate biochemical response.  We discussed some of the potential indications for adjuvant radiotherapy including positive margins, extracapsular extension, and seminal vesicle involvement. We also talked about some of the other potential findings leading to a recommendation for radiotherapy including a non-zero postoperative PSA and positive lymph nodes.   At the end of the conversation the patient is interested in moving forward with prostatectomy. We will share our discussion with Dr. Alinda Money. We enjoyed meeting with him today, and will look forward to following his progress.  This encounter was provided by telemedicine platform Webex.  The patient has given verbal consent for this type of encounter and has been advised to only accept a meeting of this type in a secure network environment. The time spent during this encounter was 60 minutes. The attendants for this meeting include Tyler Pita MD, Freeman Caldron PA-C, scribe Clinton Sawyer and patient, Phillip Gates.  During the encounter, Tyler Pita MD, Ashlyn Bruning PA-C, and scribe Clinton Sawyer were located at [facility] Radiation Oncology Department.  Patient, Phillip Gates was located at home.    Nicholos Johns, PA-C    Tyler Pita, MD  [facility] Direct Dial: [phone]   Fax: [phone] [email or web address]   Skype   [email or web address]  This document serves as a record of services personally performed by Tyler Pita, MD and Freeman Caldron, PA-C. It was created on their behalf by Rae Lips, a trained medical scribe. The creation of this record is based on the scribe's personal observations and the providers' statements to them. This document has been checked and approved by the attending providers.

## 2019-02-26 NOTE — Progress Notes (Signed)
See progress note under physician encounter. 

## 2019-03-06 ENCOUNTER — Other Ambulatory Visit: Payer: Self-pay | Admitting: Urology

## 2019-03-11 ENCOUNTER — Telehealth: Payer: Self-pay | Admitting: Medical Oncology

## 2019-03-11 NOTE — Telephone Encounter (Signed)
Spoke with Mr. Ellerby to introduce myself as the prostate nurse navigator and discuss my role. I was unable to meet him 6/2 during consult with Dr. Tammi Klippel. His visit went well but he has decided to proceed with robotic prostatectomy. He is scheduled for surgery 05/09/19 with Dr. Alinda Money. I wished him well and encouraged him to call if I can assist him in any way. He thanked me for the call and voiced understanding.

## 2019-05-03 NOTE — Patient Instructions (Addendum)
YOU NEED TO HAVE A COVID 19 TEST ON____8-10-2020___ @___0915____ , THIS TEST MUST BE DONE BEFORE SURGERY, [address]  [address] [place] , [address]. ONCE YOUR COVID TEST IS COMPLETED, PLEASE BEGIN THE QUARANTINE INSTRUCTIONS AS OUTLINED IN YOUR HANDOUT.                Phillip Gates    Your procedure is scheduled on: 05-09-2019   Report to [facility]  Entrance    Report to admitting at 9:15AM   1 VISITOR IS ALLOWED TO WAIT IN WAITING ROOM  ONLY DAY OF YOUR SURGERY.    Call this number if you have problems the morning of surgery [phone]    Remember: [place].  Do not eat food or drink liquids :After Midnight.     BRUSH YOUR TEETH MORNING OF SURGERY AND RINSE YOUR MOUTH OUT, NO CHEWING GUM CANDY OR MINTS.     Take these medicines the morning of surgery with A SIP OF WATER: OMEPRAZOLE, TYLENOL IF NEEDED, NASAL SPRAY , EYE DROPS                                 You may not have any metal on your body including hair pins and              piercings  Do not wear jewelry, make-up, lotions, powders or perfumes, deodorant               Men may shave face and neck.   Do not bring valuables to the hospital. [place].  Contacts, dentures or bridgework may not be worn into surgery.                Please read over the following fact sheets you were given: _____________________________________________________________________             [facility] - Preparing for Surgery Before surgery, you can play an important role.  Because skin is not sterile, your skin needs to be as free of germs as possible.  You can reduce the number of germs on your skin by washing with CHG (chlorahexidine gluconate) soap before surgery.  CHG is an antiseptic cleaner which kills germs and bonds with the skin to continue killing germs even after washing. Please DO NOT use if you have  an allergy to CHG or antibacterial soaps.  If your skin becomes reddened/irritated stop using the CHG and inform your nurse when you arrive at Short Stay. Do not shave (including legs and underarms) for at least 48 hours prior to the first CHG shower.  You may shave your face/neck. Please follow these instructions carefully:  1.  Shower with CHG Soap the night before surgery and the  morning of Surgery.  2.  If you choose to wash your hair, wash your hair first as usual with your  normal  shampoo.  3.  After you shampoo, rinse your hair and body thoroughly to remove the  shampoo.                           4.  Use CHG as you would any other liquid soap.  You can apply chg directly  to the skin and wash  Gently with a scrungie or clean washcloth.  5.  Apply the CHG Soap to your body ONLY FROM THE NECK DOWN.   Do not use on face/ open                           Wound or open sores. Avoid contact with eyes, ears mouth and genitals (private parts).                       Wash face,  Genitals (private parts) with your normal soap.             6.  Wash thoroughly, paying special attention to the area where your surgery  will be performed.  7.  Thoroughly rinse your body with warm water from the neck down.  8.  DO NOT shower/wash with your normal soap after using and rinsing off  the CHG Soap.                9.  Pat yourself dry with a clean towel.            10.  Wear clean pajamas.            11.  Place clean sheets on your bed the night of your first shower and do not  sleep with pets. Day of Surgery : Do not apply any lotions/deodorants the morning of surgery.  Please wear clean clothes to the hospital/surgery center.  FAILURE TO FOLLOW THESE INSTRUCTIONS MAY RESULT IN THE CANCELLATION OF YOUR SURGERY PATIENT SIGNATURE_________________________________  NURSE  SIGNATURE__________________________________  ________________________________________________________________________  WHAT IS A BLOOD TRANSFUSION? Blood Transfusion Information  A transfusion is the replacement of blood or some of its parts. Blood is made up of multiple cells which provide different functions.  Red blood cells carry oxygen and are used for blood loss replacement.  White blood cells fight against infection.  Platelets control bleeding.  Plasma helps clot blood.  Other blood products are available for specialized needs, such as hemophilia or other clotting disorders. BEFORE THE TRANSFUSION  Who gives blood for transfusions?   Healthy volunteers who are fully evaluated to make sure their blood is safe. This is blood bank blood. Transfusion therapy is the safest it has ever been in the practice of medicine. Before blood is taken from a donor, a complete history is taken to make sure that person has no history of diseases nor engages in risky social behavior (examples are intravenous drug use or sexual activity with multiple partners). The donor's travel history is screened to minimize risk of transmitting infections, such as malaria. The donated blood is tested for signs of infectious diseases, such as HIV and hepatitis. The blood is then tested to be sure it is compatible with you in order to minimize the chance of a transfusion reaction. If you or a relative donates blood, this is often done in anticipation of surgery and is not appropriate for emergency situations. It takes many days to process the donated blood. RISKS AND COMPLICATIONS Although transfusion therapy is very safe and saves many lives, the main dangers of transfusion include:   Getting an infectious disease.  Developing a transfusion reaction. This is an allergic reaction to something in the blood you were given. Every precaution is taken to prevent this. The decision to have a blood transfusion has been  considered carefully by your caregiver before blood is given. Blood is not given unless the benefits  outweigh the risks. AFTER THE TRANSFUSION  Right after receiving a blood transfusion, you will usually feel much better and more energetic. This is especially true if your red blood cells have gotten low (anemic). The transfusion raises the level of the red blood cells which carry oxygen, and this usually causes an energy increase.  The nurse administering the transfusion will monitor you carefully for complications. HOME CARE INSTRUCTIONS  No special instructions are needed after a transfusion. You may find your energy is better. Speak with your caregiver about any limitations on activity for underlying diseases you may have. SEEK MEDICAL CARE IF:   Your condition is not improving after your transfusion.  You develop redness or irritation at the intravenous (IV) site. SEEK IMMEDIATE MEDICAL CARE IF:  Any of the following symptoms occur over the next 12 hours:  Shaking chills.  You have a temperature by mouth above 102 F (38.9 C), not controlled by medicine.  Chest, back, or muscle pain.  People around you feel you are not acting correctly or are confused.  Shortness of breath or difficulty breathing.  Dizziness and fainting.  You get a rash or develop hives.  You have a decrease in urine output.  Your urine turns a dark color or changes to pink, red, or brown. Any of the following symptoms occur over the next 10 days:  You have a temperature by mouth above 102 F (38.9 C), not controlled by medicine.  Shortness of breath.  Weakness after normal activity.  The white part of the eye turns yellow (jaundice).  You have a decrease in the amount of urine or are urinating less often.  Your urine turns a dark color or changes to pink, red, or brown. Document Released: 09/09/2000 Document Revised: 12/05/2011 Document Reviewed: 04/28/2008 [facility] Patient Information 2014  [place], [place].  ______________________________________________________________________

## 2019-05-06 ENCOUNTER — Encounter (HOSPITAL_COMMUNITY)
Admission: RE | Admit: 2019-05-06 | Discharge: 2019-05-06 | Disposition: A | Discharge status: Home / Self Care | Payer: BC Managed Care – PPO | Source: Ambulatory Visit | Attending: Urology | Admitting: Urology

## 2019-05-06 ENCOUNTER — Encounter (HOSPITAL_COMMUNITY): Payer: Self-pay

## 2019-05-06 ENCOUNTER — Other Ambulatory Visit: Payer: Self-pay

## 2019-05-06 ENCOUNTER — Other Ambulatory Visit (HOSPITAL_COMMUNITY)
Admission: RE | Admit: 2019-05-06 | Discharge: 2019-05-06 | Disposition: A | Discharge status: Home / Self Care | Payer: BC Managed Care – PPO | Source: Ambulatory Visit | Attending: Urology | Admitting: Urology

## 2019-05-06 DIAGNOSIS — Z01812 Encounter for preprocedural laboratory examination: Secondary | ICD-10-CM | POA: Diagnosis not present

## 2019-05-06 DIAGNOSIS — C61 Malignant neoplasm of prostate: Secondary | ICD-10-CM | POA: Insufficient documentation

## 2019-05-06 DIAGNOSIS — Z20828 Contact with and (suspected) exposure to other viral communicable diseases: Secondary | ICD-10-CM | POA: Diagnosis not present

## 2019-05-06 HISTORY — DX: Calculus of kidney: N20.0

## 2019-05-06 LAB — CBC
HCT: 45.8 % (ref 39.0–52.0)
Hemoglobin: 14.8 g/dL (ref 13.0–17.0)
MCH: 28.2 pg (ref 26.0–34.0)
MCHC: 32.3 g/dL (ref 30.0–36.0)
MCV: 87.4 fL (ref 80.0–100.0)
Platelets: 147 10*3/uL — ABNORMAL LOW (ref 150–400)
RBC: 5.24 MIL/uL (ref 4.22–5.81)
RDW: 13.5 % (ref 11.5–15.5)
WBC: 6.7 10*3/uL (ref 4.0–10.5)
nRBC: 0 % (ref 0.0–0.2)

## 2019-05-06 LAB — SARS CORONAVIRUS 2 (TAT 6-24 HRS): SARS Coronavirus 2: NEGATIVE

## 2019-05-06 LAB — BASIC METABOLIC PANEL
Anion gap: 7 (ref 5–15)
BUN: 16 mg/dL (ref 6–20)
CO2: 25 mmol/L (ref 22–32)
Calcium: 8.9 mg/dL (ref 8.9–10.3)
Chloride: 107 mmol/L (ref 98–111)
Creatinine, Ser: 0.97 mg/dL (ref 0.61–1.24)
GFR calc Af Amer: >60 mL/min (ref >60)
GFR calc non Af Amer: >60 mL/min (ref >60)
Glucose, Bld: 103 mg/dL — ABNORMAL HIGH (ref 70–99)
Potassium: 4 mmol/L (ref 3.5–5.1)
Sodium: 139 mmol/L (ref 135–145)

## 2019-05-06 LAB — ABO/RH: ABO/RH(D): O POS

## 2019-05-06 NOTE — Progress Notes (Signed)
CBC ROUTED TO DR Alinda Money

## 2019-05-08 NOTE — H&P (Signed)
CC/HPI: Prostate Cancer   He was noted to have an elevated PSA of 4.94 prompting a TRUS biopsy of the prostate on 05/18/18. 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 After an initial consultation with me in October 2019, he elected to proceed with active surveillance.   Family history: He had a brother who was diagnosed at age 59 with prostate cancer.  Imaging studies: None   Initial diagnosis: August 2019  TNM stage: cT1c Nx Mx  PSA at diagnosis: 6.28  Gleason score: 3+3=6  Biopsy (Acc # [id number]): 5/12 cores positive  Left: Left lateral base (5%, 3+3=6)  Right: Right apex (5%, 3+3=6), right mid (5%, 3+3=6), right base (5%, 3+3=6), right lateral base (less than 5%, 3+3=6)  Prostate volume: 41 cc  PSAD: 0.15   Past medical history: He has a history of gastroesophageal reflux disease and hyperlipidemia.  Past surgical history: He has undergone a prior lower midline incision for colon resection due to diverticulitis. He has undergone a right inguinal hernia repair.   Baseline urinary function: IPSS is 2.  Baseline erectile function: SHIM score is 25. He does have significant erectile dysfunction without medication but is able to achieve erections regularly with Viagra 100 mg.   He underwent a surveillance confirmatory evaluation with an MRI in April 2020 indicating a 1.9 cm PI-RADS 5 lesion at the left anterior apex. An MR/US fusion biopsy in April 2020 demonstrated upgrade Gleason 3+4=7 adenocarcinoma with a total of 15 of 30 biopsy cores positive for malignancy including 4 out of 4 targeted biopsies.     ALLERGIES: Nkda    MEDICATIONS: Omeprazole 20 mg capsule,delayed release  Tadalafil 20 mg tablet 1 tablet PO prn as directed  Atorvastatin Calcium 40 mg tablet  Flonase Allergy Relief     GU PSH: Prostate Needle Biopsy - 01/24/2019      Radiation Notes: colon resection   NON-GU PSH: Cholecystectomy (laparoscopic) Surgical Pathology,  Gross And Microscopic Examination For Prostate Needle - 01/24/2019    GU PMH: ED due to arterial insufficiency - 01/18/2019 Prostate Cancer - 07/10/2018      PMH Notes:   1) Prostate Cancer: He was noted to have an elevated PSA of 4.94 prompting a TRUS biopsy of the prostate on 05/18/18. 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 After an initial consultation with me in October 2019, he elected to proceed with active surveillance.   Family history: He had a brother who was diagnosed at age 49 with prostate cancer.  Imaging studies: None   Initial diagnosis: August 2019  TNM stage: cT1c Nx Mx  PSA at diagnosis: 6.28  Gleason score: 3+3=6  Biopsy (Acc # [id number]): 5/12 cores positive  Left: Left lateral base (5%, 3+3=6)  Right: Right apex (5%, 3+3=6), right mid (5%, 3+3=6), right base (5%, 3+3=6), right lateral base (less than 5%, 3+3=6)  Prostate volume: 41 cc  PSAD: 0.15   Baseline urinary function: IPSS is 2.  Baseline erectile function: SHIM score is 25. He does have significant erectile dysfunction without medication but is able to achieve erections regularly with Viagra 100 mg.     NON-GU PMH: GERD    FAMILY HISTORY: 1 Daughter - Daughter 1 son - Son Prostate Cancer - Brother   SOCIAL HISTORY: Marital Status: Divorced Preferred Language: English; Ethnicity: Not Hispanic Or Latino; Race: White Current Smoking Status: Patient has never smoked.   Tobacco Use Assessment Completed: Used Tobacco in last 30 days?  Has never drank.  Drinks 2 caffeinated drinks per day.    REVIEW OF SYSTEMS:    GU Review Male:   Patient denies frequent urination, hard to postpone urination, burning/ pain with urination, get up at night to urinate, leakage of urine, stream starts and stops, trouble starting your streams, and have to strain to urinate .  Gastrointestinal (Lower):   Patient denies diarrhea and constipation.  Gastrointestinal (Upper):   Patient  denies nausea and vomiting.  Constitutional:   Patient denies fever, night sweats, weight loss, and fatigue.  Skin:   Patient denies skin rash/ lesion and itching.  Eyes:   Patient denies blurred vision and double vision.  Ears/ Nose/ Throat:   Patient denies sore throat and sinus problems.  Hematologic/Lymphatic:   Patient denies swollen glands and easy bruising.  Cardiovascular:   Patient denies leg swelling and chest pains.  Respiratory:   Patient denies cough and shortness of breath.  Endocrine:   Patient denies excessive thirst.  Musculoskeletal:   Patient denies back pain and joint pain.  Neurological:   Patient denies headaches and dizziness.  Psychologic:   Patient denies depression and anxiety.   VITAL SIGNS:      Weight 210 lb / 95.25 kg  Height 70 in / 177.8 cm  BMI 30.1 kg/m   MULTI-SYSTEM PHYSICAL EXAMINATION:    Constitutional: Well-nourished. No physical deformities. Normally developed. Good grooming.  Neck: Neck symmetrical, not swollen. Normal tracheal position.  Respiratory: No labored breathing, no use of accessory muscles. Clear bilaterally.  Cardiovascular: Normal temperature, normal extremity pulses, no swelling, no varicosities. Regular rate and rhythm.  Lymphatic: No enlargement of neck, axillae, groin.  Skin: No paleness, no jaundice, no cyanosis. No lesion, no ulcer, no rash.  Neurologic / Psychiatric: Oriented to time, oriented to place, oriented to person. No depression, no anxiety, no agitation.  Gastrointestinal: He has a lower midline incision. He has a right inguinal incision.  Eyes: Normal conjunctivae. Normal eyelids.  Ears, Nose, Mouth, and Throat: Left ear no scars, no lesions, no masses. Right ear no scars, no lesions, no masses. Nose no scars, no lesions, no masses. Normal hearing. Normal lips.  Musculoskeletal: Normal gait and station of head and neck.       ASSESSMENT:      ICD-10 Details  1 GU:   Prostate Cancer - C61    PLAN:           1. Favorable intermediate risk prostate cancer: He has elected surgical treatment. My plan is to perform a bilateral nerve-sparing robot assisted laparoscopic radical prostatectomy and bilateral pelvic lymphadenectomy. He understands that he is at an increased risk for possible open surgical conversion.

## 2019-05-09 ENCOUNTER — Observation Stay (HOSPITAL_COMMUNITY): Payer: BC Managed Care – PPO

## 2019-05-09 ENCOUNTER — Ambulatory Visit (HOSPITAL_COMMUNITY): Payer: BC Managed Care – PPO | Admitting: Certified Registered Nurse Anesthetist

## 2019-05-09 ENCOUNTER — Observation Stay (HOSPITAL_COMMUNITY)
Admission: RE | Admit: 2019-05-09 | Discharge: 2019-05-10 | Disposition: A | Discharge status: Home / Self Care | Payer: BC Managed Care – PPO | Source: Ambulatory Visit | Attending: Urology | Admitting: Urology

## 2019-05-09 ENCOUNTER — Encounter (HOSPITAL_COMMUNITY): Payer: Self-pay

## 2019-05-09 ENCOUNTER — Encounter (HOSPITAL_COMMUNITY)
Admission: RE | Disposition: A | Discharge status: Home / Self Care | Payer: Self-pay | Source: Ambulatory Visit | Attending: Urology

## 2019-05-09 ENCOUNTER — Ambulatory Visit (HOSPITAL_COMMUNITY): Payer: BC Managed Care – PPO | Admitting: Physician Assistant

## 2019-05-09 ENCOUNTER — Other Ambulatory Visit: Payer: Self-pay

## 2019-05-09 DIAGNOSIS — Z79899 Other long term (current) drug therapy: Secondary | ICD-10-CM | POA: Diagnosis not present

## 2019-05-09 DIAGNOSIS — C61 Malignant neoplasm of prostate: Principal | ICD-10-CM | POA: Diagnosis present

## 2019-05-09 DIAGNOSIS — E785 Hyperlipidemia, unspecified: Secondary | ICD-10-CM | POA: Insufficient documentation

## 2019-05-09 DIAGNOSIS — N5201 Erectile dysfunction due to arterial insufficiency: Secondary | ICD-10-CM | POA: Insufficient documentation

## 2019-05-09 DIAGNOSIS — K219 Gastro-esophageal reflux disease without esophagitis: Secondary | ICD-10-CM | POA: Insufficient documentation

## 2019-05-09 DIAGNOSIS — Z419 Encounter for procedure for purposes other than remedying health state, unspecified: Secondary | ICD-10-CM

## 2019-05-09 HISTORY — PX: ROBOT ASSISTED LAPAROSCOPIC RADICAL PROSTATECTOMY: SHX5141

## 2019-05-09 HISTORY — PX: LYMPHADENECTOMY: SHX5960

## 2019-05-09 LAB — TYPE AND SCREEN
ABO/RH(D): O POS
Antibody Screen: NEGATIVE

## 2019-05-09 LAB — HEMOGLOBIN AND HEMATOCRIT, BLOOD
HCT: 47.2 % (ref 39.0–52.0)
Hemoglobin: 15.1 g/dL (ref 13.0–17.0)

## 2019-05-09 SURGERY — XI ROBOTIC ASSISTED LAPAROSCOPIC RADICAL PROSTATECTOMY LEVEL 3
Anesthesia: General

## 2019-05-09 MED ORDER — FENTANYL CITRATE (PF) 100 MCG/2ML IJ SOLN
INTRAMUSCULAR | Status: DC | PRN
  Administered 2019-05-09 (×5): 50 ug via INTRAVENOUS

## 2019-05-09 MED ORDER — LIDOCAINE 2% (20 MG/ML) 5 ML SYRINGE
INTRAMUSCULAR | Status: AC
  Filled 2019-05-09: qty 5

## 2019-05-09 MED ORDER — ONDANSETRON HCL 4 MG/2ML IJ SOLN: 4.0000 mg | INTRAMUSCULAR | Status: DC | PRN

## 2019-05-09 MED ORDER — HYDROMORPHONE HCL 1 MG/ML IJ SOLN
INTRAMUSCULAR | Status: AC
  Administered 2019-05-09: 15:00:00
  Filled 2019-05-09: qty 1

## 2019-05-09 MED ORDER — LACTATED RINGERS IV SOLN
INTRAVENOUS | Status: DC | PRN
  Administered 2019-05-09: 12:00:00 1000 mL

## 2019-05-09 MED ORDER — DEXAMETHASONE SODIUM PHOSPHATE 10 MG/ML IJ SOLN
INTRAMUSCULAR | Status: AC
  Filled 2019-05-09: qty 1

## 2019-05-09 MED ORDER — ATORVASTATIN CALCIUM 40 MG PO TABS: 40.0000 mg | ORAL_TABLET | ORAL | Status: DC

## 2019-05-09 MED ORDER — HEPARIN SODIUM (PORCINE) 1000 UNIT/ML IJ SOLN
INTRAMUSCULAR | Status: AC
  Filled 2019-05-09: qty 1

## 2019-05-09 MED ORDER — TRAMADOL HCL 50 MG PO TABS: 50.0000 mg | ORAL_TABLET | Freq: Four times a day (QID) | ORAL | 0 refills | Status: DC | PRN

## 2019-05-09 MED ORDER — DOCUSATE SODIUM 100 MG PO CAPS
100.0000 mg | ORAL_CAPSULE | Freq: Two times a day (BID) | ORAL | Status: DC
  Administered 2019-05-09 – 2019-05-10 (×2): 100 mg via ORAL
  Filled 2019-05-09 (×2): qty 1

## 2019-05-09 MED ORDER — ZOLPIDEM TARTRATE 5 MG PO TABS: 5.0000 mg | ORAL_TABLET | Freq: Every evening | ORAL | Status: DC | PRN

## 2019-05-09 MED ORDER — EPHEDRINE SULFATE-NACL 50-0.9 MG/10ML-% IV SOSY
PREFILLED_SYRINGE | INTRAVENOUS | Status: DC | PRN
  Administered 2019-05-09: 10 mg via INTRAVENOUS

## 2019-05-09 MED ORDER — SUGAMMADEX SODIUM 500 MG/5ML IV SOLN
INTRAVENOUS | Status: DC | PRN
  Administered 2019-05-09: 300 mg via INTRAVENOUS

## 2019-05-09 MED ORDER — KCL IN DEXTROSE-NACL 20-5-0.45 MEQ/L-%-% IV SOLN
INTRAVENOUS | Status: DC
  Administered 2019-05-09: 16:00:00 via INTRAVENOUS
  Filled 2019-05-09 (×3): qty 1000

## 2019-05-09 MED ORDER — MIDAZOLAM HCL 2 MG/2ML IJ SOLN
INTRAMUSCULAR | Status: AC
  Filled 2019-05-09: qty 2

## 2019-05-09 MED ORDER — ROCURONIUM BROMIDE 50 MG/5ML IV SOSY
PREFILLED_SYRINGE | INTRAVENOUS | Status: DC | PRN
  Administered 2019-05-09 (×2): 20 mg via INTRAVENOUS
  Administered 2019-05-09: 50 mg via INTRAVENOUS
  Administered 2019-05-09: 20 mg via INTRAVENOUS

## 2019-05-09 MED ORDER — SODIUM CHLORIDE 0.9 % IR SOLN
Status: DC | PRN
  Administered 2019-05-09: 1000 mL via INTRAVESICAL

## 2019-05-09 MED ORDER — PANTOPRAZOLE SODIUM 40 MG PO TBEC
40.0000 mg | DELAYED_RELEASE_TABLET | Freq: Every day | ORAL | Status: DC
  Administered 2019-05-10: 40 mg via ORAL
  Filled 2019-05-09: qty 1

## 2019-05-09 MED ORDER — MORPHINE SULFATE (PF) 2 MG/ML IV SOLN
2.0000 mg | INTRAVENOUS | Status: DC | PRN
  Administered 2019-05-09 – 2019-05-10 (×2): 2 mg via INTRAVENOUS
  Filled 2019-05-09 (×2): qty 1

## 2019-05-09 MED ORDER — STERILE WATER FOR IRRIGATION IR SOLN
Status: DC | PRN
  Administered 2019-05-09: 1000 mL

## 2019-05-09 MED ORDER — SUGAMMADEX SODIUM 500 MG/5ML IV SOLN
INTRAVENOUS | Status: AC
  Filled 2019-05-09: qty 5

## 2019-05-09 MED ORDER — ALBUTEROL SULFATE HFA 108 (90 BASE) MCG/ACT IN AERS
INHALATION_SPRAY | RESPIRATORY_TRACT | Status: DC | PRN
  Administered 2019-05-09 (×2): 2 via RESPIRATORY_TRACT

## 2019-05-09 MED ORDER — PROMETHAZINE HCL 25 MG/ML IJ SOLN: 6.2500 mg | INTRAMUSCULAR | Status: DC | PRN

## 2019-05-09 MED ORDER — BACITRACIN-NEOMYCIN-POLYMYXIN 400-5-5000 EX OINT: 1.0000 "application " | TOPICAL_OINTMENT | Freq: Three times a day (TID) | CUTANEOUS | Status: DC | PRN

## 2019-05-09 MED ORDER — POLYVINYL ALCOHOL 1.4 % OP SOLN
1.0000 [drp] | Freq: Two times a day (BID) | OPHTHALMIC | Status: DC
  Administered 2019-05-09 – 2019-05-10 (×2): 1 [drp] via OPHTHALMIC
  Filled 2019-05-09: qty 15

## 2019-05-09 MED ORDER — LACTATED RINGERS IV SOLN
INTRAVENOUS | Status: DC
  Administered 2019-05-09 (×2): via INTRAVENOUS

## 2019-05-09 MED ORDER — BUPIVACAINE-EPINEPHRINE 0.25% -1:200000 IJ SOLN
INTRAMUSCULAR | Status: DC | PRN
  Administered 2019-05-09: 30 mL

## 2019-05-09 MED ORDER — DEXAMETHASONE SODIUM PHOSPHATE 10 MG/ML IJ SOLN
INTRAMUSCULAR | Status: DC | PRN
  Administered 2019-05-09: 10 mg via INTRAVENOUS

## 2019-05-09 MED ORDER — PROPOFOL 10 MG/ML IV BOLUS
INTRAVENOUS | Status: AC
  Filled 2019-05-09: qty 20

## 2019-05-09 MED ORDER — ROCURONIUM BROMIDE 10 MG/ML (PF) SYRINGE
PREFILLED_SYRINGE | INTRAVENOUS | Status: AC
  Filled 2019-05-09: qty 10

## 2019-05-09 MED ORDER — CEFAZOLIN SODIUM-DEXTROSE 1-4 GM/50ML-% IV SOLN
1.0000 g | Freq: Three times a day (TID) | INTRAVENOUS | Status: AC
  Administered 2019-05-09 – 2019-05-10 (×2): 1 g via INTRAVENOUS
  Filled 2019-05-09 (×2): qty 50

## 2019-05-09 MED ORDER — ACETAMINOPHEN 325 MG PO TABS: 650.0000 mg | ORAL_TABLET | ORAL | Status: DC | PRN

## 2019-05-09 MED ORDER — KETOROLAC TROMETHAMINE 15 MG/ML IJ SOLN
15.0000 mg | Freq: Four times a day (QID) | INTRAMUSCULAR | Status: DC
  Administered 2019-05-09 – 2019-05-10 (×3): 15 mg via INTRAVENOUS
  Filled 2019-05-09 (×4): qty 1

## 2019-05-09 MED ORDER — MIDAZOLAM HCL 5 MG/5ML IJ SOLN
INTRAMUSCULAR | Status: DC | PRN
  Administered 2019-05-09: 2 mg via INTRAVENOUS

## 2019-05-09 MED ORDER — ONDANSETRON HCL 4 MG/2ML IJ SOLN
INTRAMUSCULAR | Status: AC
  Filled 2019-05-09: qty 2

## 2019-05-09 MED ORDER — BELLADONNA ALKALOIDS-OPIUM 16.2-60 MG RE SUPP: 1.0000 | Freq: Four times a day (QID) | RECTAL | Status: DC | PRN

## 2019-05-09 MED ORDER — ONDANSETRON HCL 4 MG/2ML IJ SOLN
INTRAMUSCULAR | Status: DC | PRN
  Administered 2019-05-09: 4 mg via INTRAVENOUS

## 2019-05-09 MED ORDER — SUCCINYLCHOLINE CHLORIDE 200 MG/10ML IV SOSY
PREFILLED_SYRINGE | INTRAVENOUS | Status: DC | PRN
  Administered 2019-05-09: 120 mg via INTRAVENOUS

## 2019-05-09 MED ORDER — PHENYLEPHRINE 40 MCG/ML (10ML) SYRINGE FOR IV PUSH (FOR BLOOD PRESSURE SUPPORT)
PREFILLED_SYRINGE | INTRAVENOUS | Status: DC | PRN
  Administered 2019-05-09: 40 ug via INTRAVENOUS
  Administered 2019-05-09: 80 ug via INTRAVENOUS
  Administered 2019-05-09: 40 ug via INTRAVENOUS
  Administered 2019-05-09: 80 ug via INTRAVENOUS

## 2019-05-09 MED ORDER — KETOROLAC TROMETHAMINE 30 MG/ML IJ SOLN: 30.0000 mg | Freq: Once | INTRAMUSCULAR | Status: DC | PRN

## 2019-05-09 MED ORDER — DIPHENHYDRAMINE HCL 12.5 MG/5ML PO ELIX: 12.5000 mg | ORAL_SOLUTION | Freq: Four times a day (QID) | ORAL | Status: DC | PRN

## 2019-05-09 MED ORDER — HYDROMORPHONE HCL 1 MG/ML IJ SOLN
0.2500 mg | INTRAMUSCULAR | Status: DC | PRN
  Administered 2019-05-09 (×4): 0.5 mg via INTRAVENOUS

## 2019-05-09 MED ORDER — SULFAMETHOXAZOLE-TRIMETHOPRIM 800-160 MG PO TABS: 1.0000 | ORAL_TABLET | Freq: Two times a day (BID) | ORAL | 0 refills | Status: DC

## 2019-05-09 MED ORDER — DIPHENHYDRAMINE HCL 50 MG/ML IJ SOLN: 12.5000 mg | Freq: Four times a day (QID) | INTRAMUSCULAR | Status: DC | PRN

## 2019-05-09 MED ORDER — LIDOCAINE 2% (20 MG/ML) 5 ML SYRINGE
INTRAMUSCULAR | Status: DC | PRN
  Administered 2019-05-09: 100 mg via INTRAVENOUS

## 2019-05-09 MED ORDER — CEFAZOLIN SODIUM-DEXTROSE 2-4 GM/100ML-% IV SOLN
2.0000 g | Freq: Once | INTRAVENOUS | Status: AC
  Administered 2019-05-09: 2 g via INTRAVENOUS
  Filled 2019-05-09: qty 100

## 2019-05-09 MED ORDER — FENTANYL CITRATE (PF) 250 MCG/5ML IJ SOLN
INTRAMUSCULAR | Status: AC
  Filled 2019-05-09: qty 5

## 2019-05-09 MED ORDER — BUPIVACAINE-EPINEPHRINE (PF) 0.25% -1:200000 IJ SOLN
INTRAMUSCULAR | Status: AC
  Filled 2019-05-09: qty 30

## 2019-05-09 MED ORDER — MAGNESIUM CITRATE PO SOLN
1.0000 | Freq: Once | ORAL | Status: AC
  Administered 2019-05-09: 09:00:00 1 via ORAL
  Filled 2019-05-09: qty 296

## 2019-05-09 MED ORDER — FLEET ENEMA 7-19 GM/118ML RE ENEM
1.0000 | ENEMA | Freq: Once | RECTAL | Status: AC
  Administered 2019-05-09: 1 via RECTAL
  Filled 2019-05-09: qty 1

## 2019-05-09 MED ORDER — PROPOFOL 10 MG/ML IV BOLUS
INTRAVENOUS | Status: DC | PRN
  Administered 2019-05-09: 200 mg via INTRAVENOUS

## 2019-05-09 MED ORDER — FLUTICASONE PROPIONATE 50 MCG/ACT NA SUSP
1.0000 | Freq: Two times a day (BID) | NASAL | Status: DC | PRN
  Filled 2019-05-09: qty 16

## 2019-05-09 MED ORDER — SODIUM CHLORIDE 0.9 % IV BOLUS
1000.0000 mL | Freq: Once | INTRAVENOUS | Status: AC
  Administered 2019-05-09: 1000 mL via INTRAVENOUS

## 2019-05-09 SURGICAL SUPPLY — 59 items
APPLICATOR COTTON TIP 6 STRL (MISCELLANEOUS) ×2 IMPLANT
APPLICATOR COTTON TIP 6IN STRL (MISCELLANEOUS) ×4
CATH FOLEY 2WAY SLVR 18FR 30CC (CATHETERS) ×4 IMPLANT
CATH ROBINSON RED A/P 16FR (CATHETERS) ×4 IMPLANT
CATH ROBINSON RED A/P 8FR (CATHETERS) ×4 IMPLANT
CATH TIEMANN FOLEY 18FR 5CC (CATHETERS) ×4 IMPLANT
CHLORAPREP W/TINT 26 (MISCELLANEOUS) ×4 IMPLANT
CLIP VESOLOCK LG 6/CT PURPLE (CLIP) ×8 IMPLANT
COVER SURGICAL LIGHT HANDLE (MISCELLANEOUS) ×4 IMPLANT
COVER TIP SHEARS 8 DVNC (MISCELLANEOUS) ×2 IMPLANT
COVER TIP SHEARS 8MM DA VINCI (MISCELLANEOUS) ×2
COVER WAND RF STERILE (DRAPES) IMPLANT
CUTTER ECHEON FLEX ENDO 45 340 (ENDOMECHANICALS) ×4 IMPLANT
DECANTER SPIKE VIAL GLASS SM (MISCELLANEOUS) ×4 IMPLANT
DERMABOND ADVANCED (GAUZE/BANDAGES/DRESSINGS) ×2
DERMABOND ADVANCED .7 DNX12 (GAUZE/BANDAGES/DRESSINGS) ×2 IMPLANT
DRAPE ARM DVNC X/XI (DISPOSABLE) ×8 IMPLANT
DRAPE COLUMN DVNC XI (DISPOSABLE) ×2 IMPLANT
DRAPE DA VINCI XI ARM (DISPOSABLE) ×8
DRAPE DA VINCI XI COLUMN (DISPOSABLE) ×2
DRAPE SURG IRRIG POUCH 19X23 (DRAPES) ×4 IMPLANT
DRSG TEGADERM 4X4.75 (GAUZE/BANDAGES/DRESSINGS) ×4 IMPLANT
ELECT PENCIL ROCKER SW 15FT (MISCELLANEOUS) IMPLANT
ELECT REM PT RETURN 15FT ADLT (MISCELLANEOUS) ×4 IMPLANT
GAUZE SPONGE 2X2 8PLY STRL LF (GAUZE/BANDAGES/DRESSINGS) ×2 IMPLANT
GLOVE BIO SURGEON STRL SZ 6.5 (GLOVE) ×3 IMPLANT
GLOVE BIO SURGEONS STRL SZ 6.5 (GLOVE) ×1
GLOVE BIOGEL M STRL SZ7.5 (GLOVE) ×8 IMPLANT
GOWN STRL REUS W/TWL LRG LVL3 (GOWN DISPOSABLE) ×12 IMPLANT
HEMOSTAT SURGICEL 4X8 (HEMOSTASIS) ×4 IMPLANT
HOLDER FOLEY CATH W/STRAP (MISCELLANEOUS) ×4 IMPLANT
IRRIG SUCT STRYKERFLOW 2 WTIP (MISCELLANEOUS) ×4
IRRIGATION SUCT STRKRFLW 2 WTP (MISCELLANEOUS) ×2 IMPLANT
IV LACTATED RINGERS 1000ML (IV SOLUTION) ×4 IMPLANT
KIT TURNOVER KIT A (KITS) IMPLANT
NDL SAFETY ECLIPSE 18X1.5 (NEEDLE) ×2 IMPLANT
NEEDLE HYPO 18GX1.5 SHARP (NEEDLE) ×2
PACK ROBOT UROLOGY CUSTOM (CUSTOM PROCEDURE TRAY) ×4 IMPLANT
SCISSORS LAP 5X35 DISP (ENDOMECHANICALS) ×4 IMPLANT
SEAL CANN UNIV 5-8 DVNC XI (MISCELLANEOUS) ×8 IMPLANT
SEAL XI 5MM-8MM UNIVERSAL (MISCELLANEOUS) ×8
SOLUTION ELECTROLUBE (MISCELLANEOUS) ×4 IMPLANT
SPONGE GAUZE 2X2 STER 10/PKG (GAUZE/BANDAGES/DRESSINGS) ×2
STAPLE RELOAD 45 GRN (STAPLE) ×2 IMPLANT
STAPLE RELOAD 45MM GREEN (STAPLE) ×2
SUT ETHILON 3 0 PS 1 (SUTURE) ×4 IMPLANT
SUT MNCRL 3 0 RB1 (SUTURE) ×2 IMPLANT
SUT MNCRL 3 0 VIOLET RB1 (SUTURE) ×2 IMPLANT
SUT MNCRL AB 4-0 PS2 18 (SUTURE) ×8 IMPLANT
SUT MONOCRYL 3 0 RB1 (SUTURE) ×4
SUT VIC AB 0 CT1 27 (SUTURE) ×2
SUT VIC AB 0 CT1 27XBRD ANTBC (SUTURE) ×2 IMPLANT
SUT VIC AB 0 UR5 27 (SUTURE) ×4 IMPLANT
SUT VIC AB 2-0 SH 27 (SUTURE) ×2
SUT VIC AB 2-0 SH 27X BRD (SUTURE) ×2 IMPLANT
SUT VICRYL 0 UR6 27IN ABS (SUTURE) ×8 IMPLANT
SYR 27GX1/2 1ML LL SAFETY (SYRINGE) ×4 IMPLANT
TOWEL OR NON WOVEN STRL DISP B (DISPOSABLE) ×4 IMPLANT
WATER STERILE IRR 1000ML POUR (IV SOLUTION) ×4 IMPLANT

## 2019-05-09 NOTE — Progress Notes (Signed)
Patient ID: Phillip Gates, male   DOB: 01-23-1961, 58 y.o.   MRN: [id number]  Post-op note  Subjective: The patient is doing well.  No complaints.  Objective: Vital signs in last 24 hours: Temp:  [97.6 F (36.4 C)-99.1 F (37.3 C)] 98.2 F (36.8 C) (08/13 1506) Pulse Rate:  [70-81] 77 (08/13 1506) Resp:  [11-18] 16 (08/13 1506) BP: (130-152)/(69-132) 130/77 (08/13 1506) SpO2:  [92 %-100 %] 96 % (08/13 1506) Weight:  [92.8 kg-94.8 kg] 92.8 kg (08/13 1506)  Intake/Output from previous day: No intake/output data recorded. Intake/Output this shift: Total I/O In: 2600 [I.V.:1500; IV Piggyback:1100] Out: 163 [Urine:50; Drains:63; Blood:50]  Physical Exam:  General: Alert and oriented. Abdomen: Soft, Nondistended. Incisions: Clean and dry. GU: Urine clearing.  Lab Results: Recent Labs    05/09/19 1346  HGB 15.1  HCT 47.2    Assessment/Plan: POD#0   1) Continue to monitor, ambulate, IS   Pryor Curia. MD   LOS: 0 days   Dutch Gray 05/09/2019, 3:28 PM

## 2019-05-09 NOTE — Anesthesia Preprocedure Evaluation (Signed)
Anesthesia Evaluation  Patient identified by MRN, date of birth, ID band Patient awake    Reviewed: Allergy & Precautions, NPO status , Patient's Chart, lab work & pertinent test results  Airway Mallampati: II  TM Distance: >3 FB Neck ROM: Full    Dental no notable dental hx.    Pulmonary neg pulmonary ROS,    Pulmonary exam normal breath sounds clear to auscultation       Cardiovascular negative cardio ROS Normal cardiovascular exam Rhythm:Regular Rate:Normal     Neuro/Psych negative neurological ROS  negative psych ROS   GI/Hepatic Neg liver ROS, GERD  Medicated,  Endo/Other  negative endocrine ROS  Renal/GU negative Renal ROS  negative genitourinary   Musculoskeletal negative musculoskeletal ROS (+)   Abdominal   Peds negative pediatric ROS (+)  Hematology negative hematology ROS (+)   Anesthesia Other Findings   Reproductive/Obstetrics negative OB ROS                             Anesthesia Physical Anesthesia Plan  ASA: II  Anesthesia Plan: General   Post-op Pain Management:    Induction: Intravenous and Rapid sequence  PONV Risk Score and Plan: 2 and Ondansetron, Dexamethasone and Treatment may vary due to age or medical condition  Airway Management Planned: Oral ETT  Additional Equipment:   Intra-op Plan:   Post-operative Plan: Extubation in OR  Informed Consent: I have reviewed the patients History and Physical, chart, labs and discussed the procedure including the risks, benefits and alternatives for the proposed anesthesia with the patient or authorized representative who has indicated his/her understanding and acceptance.     Dental advisory given  Plan Discussed with: CRNA and Surgeon  Anesthesia Plan Comments:         Anesthesia Quick Evaluation

## 2019-05-09 NOTE — Transfer of Care (Signed)
Immediate Anesthesia Transfer of Care Note  Patient: Phillip Gates  Procedure(s) Performed: XI ROBOTIC ASSISTED LAPAROSCOPIC RADICAL PROSTATECTOMY LEVEL 3 (N/A ) LYMPHADENECTOMY, PELVIC (Bilateral )  Patient Location: PACU  Anesthesia Type:General  Level of Consciousness: awake, alert , oriented and patient cooperative  Airway & Oxygen Therapy: Patient Spontanous Breathing and Patient connected to face mask oxygen  Post-op Assessment: Report given to RN and Post -op Vital signs reviewed and stable  Post vital signs: Reviewed and stable  Last Vitals:  Vitals Value Taken Time  BP 138/69 05/09/19 1331  Temp    Pulse 77 05/09/19 1338  Resp 13 05/09/19 1338  SpO2 99% 05/09/19 1338  Vitals shown include unvalidated device data.  Last Pain:  Vitals:   05/09/19 0935  TempSrc: Oral  PainSc:          Complications: No apparent anesthesia complications

## 2019-05-09 NOTE — Discharge Instructions (Signed)

## 2019-05-09 NOTE — Anesthesia Procedure Notes (Signed)
Procedure Name: Intubation Date/Time: 05/09/2019 10:55 AM Performed by: West Pugh, CRNA Pre-anesthesia Checklist: Patient identified, Emergency Drugs available, Suction available, Patient being monitored and Timeout performed Patient Re-evaluated:Patient Re-evaluated prior to induction Oxygen Delivery Method: Circle system utilized Preoxygenation: Pre-oxygenation with 100% oxygen Induction Type: IV induction, Rapid sequence and Cricoid Pressure applied Laryngoscope Size: Mac and 4 Grade View: Grade I Tube type: Oral Tube size: 7.5 mm Number of attempts: 1 Airway Equipment and Method: Stylet Placement Confirmation: ETT inserted through vocal cords under direct vision,  positive ETCO2,  CO2 detector and breath sounds checked- equal and bilateral Secured at: 23 cm Tube secured with: Tape Dental Injury: Teeth and Oropharynx as per pre-operative assessment

## 2019-05-09 NOTE — Op Note (Signed)
Preoperative diagnosis: Clinically localized adenocarcinoma of the prostate (clinical stage T1c Nx Mx)  Postoperative diagnosis: Clinically localized adenocarcinoma of the prostate (clinical stage T1c Nx Mx)  Procedure:  1. Robotic assisted laparoscopic radical prostatectomy (bilateral nerve sparing) 2. Bilateral robotic assisted laparoscopic pelvic lymphadenectomy 3. Laparoscopic adhesiolysis  Surgeon: Pryor Curia. M.D.  Assistant: Debbrah Alar, PA-C  An assistant was required for this surgical procedure.  The duties of the assistant included but were not limited to suctioning, passing suture, camera manipulation, retraction. This procedure would not be able to be performed without an Environmental consultant.  Anesthesia: General  Complications: None  EBL: 50 mL  IVF:  1400 mL crystalloid  Specimens: 1. Prostate and seminal vesicles 2. Right pelvic lymph nodes 3. Left pelvic lymph nodes  Disposition of specimens: Pathology  Drains: 1. 20 Fr coude catheter 2. # 19 Blake pelvic drain  Indication: Phillip Gates is a 58 y.o. year old patient with clinically localized prostate cancer.  After a thorough review of the management options for treatment of prostate cancer, he elected to proceed with surgical therapy and the above procedure(s).  We have discussed the potential benefits and risks of the procedure, side effects of the proposed treatment, the likelihood of the patient achieving the goals of the procedure, and any potential problems that might occur during the procedure or recuperation. Informed consent has been obtained.  Description of procedure:  The patient was taken to the operating room and a general anesthetic was administered. He was given preoperative antibiotics, placed in the dorsal lithotomy position, and prepped and draped in the usual sterile fashion. Next a preoperative timeout was performed. A urethral catheter was placed into the bladder and a site was selected  near the umbilicus for placement of the camera port. This was placed using a standard open Hassan technique which allowed entry into the peritoneal cavity under direct vision and without difficulty. An 8 mm robotic port was placed and a pneumoperitoneum established. The camera was then used to inspect the abdomen and there was no evidence of any intra-abdominal injuries.  There were extensive omental adhesions from his prior lower abdominal partial colectomy.  The left sided ports were placed with two 8 mm robotic ports placed in the left abdomen.  The adhesions were then carefully taken down sharply with laparoscopic scissors.  The total time of the adhesiolysis was 25 minutes which encompassed approximately 25-30% of the operative time. The remaining abdominal ports were then placed. An 8 mm robotic port was placed in the right lower quadrant. A 5 mm port was placed in the right upper quadrant and a 12 mm port was placed in the right lateral abdominal wall for laparoscopic assistance. All ports were placed under direct vision without difficulty. The surgical cart was then docked.   Utilizing the cautery scissors, the bladder was reflected posteriorly allowing entry into the space of Retzius and identification of the endopelvic fascia and prostate. The periprostatic fat was then removed from the prostate allowing full exposure of the endopelvic fascia. The endopelvic fascia was then incised from the apex back to the base of the prostate bilaterally and the underlying levator muscle fibers were swept laterally off the prostate thereby isolating the dorsal venous complex. The dorsal vein was then stapled and divided with a 45 mm Flex Echelon stapler. Attention then turned to the bladder neck which was divided anteriorly thereby allowing entry into the bladder and exposure of the urethral catheter. The catheter balloon was deflated and  the catheter was brought into the operative field and used to retract the  prostate anteriorly. The posterior bladder neck was then examined and was divided allowing further dissection between the bladder and prostate posteriorly until the vasa deferentia and seminal vessels were identified. The vasa deferentia were isolated, divided, and lifted anteriorly. The seminal vesicles were dissected down to their tips with care to control the seminal vascular arterial blood supply. These structures were then lifted anteriorly and the space between Denonvillier's fascia and the anterior rectum was developed with a combination of sharp and blunt dissection. This isolated the vascular pedicles of the prostate.  The lateral prostatic fascia was then sharply incised allowing release of the neurovascular bundles bilaterally. The vascular pedicles of the prostate were then ligated with Weck clips between the prostate and neurovascular bundles and divided with sharp cold scissor dissection resulting in neurovascular bundle preservation. The neurovascular bundles were then separated off the apex of the prostate and urethra bilaterally.  The urethra was then sharply transected allowing the prostate specimen to be disarticulated. The pelvis was copiously irrigated and hemostasis was ensured. There was no evidence for rectal injury.  Attention then turned to the right pelvic sidewall. The fibrofatty tissue between the external iliac vein, confluence of the iliac vessels, hypogastric artery, and Cooper's ligament was dissected free from the pelvic sidewall with care to preserve the obturator nerve. Weck clips were used for lymphostasis and hemostasis. An identical procedure was performed on the contralateral side and the lymphatic packets were removed for permanent pathologic analysis.  Attention then turned to the urethral anastomosis. A 2-0 Vicryl slip knot was placed between Denonvillier's fascia, the posterior bladder neck, and the posterior urethra to reapproximate these structures. A  double-armed 3-0 Monocryl suture was then used to perform a 360 running tension-free anastomosis between the bladder neck and urethra. A new urethral catheter was then placed into the bladder and irrigated. There were no blood clots within the bladder and the anastomosis appeared to be watertight. A #19 Blake drain was then brought through the left lateral 8 mm port site and positioned appropriately within the pelvis. It was secured to the skin with a nylon suture. The surgical cart was then undocked. The right lateral 12 mm port site was closed at the fascial level with a 0 Vicryl suture placed laparoscopically. All remaining ports were then removed under direct vision. The prostate specimen was removed intact within the Endopouch retrieval bag via the periumbilical camera port site. This fascial opening was closed with two running 0 Vicryl sutures. 0.25% Marcaine was then injected into all port sites and all incisions were reapproximated at the skin level with 4-0 Monocryl subcuticular sutures and Dermabond. The patient appeared to tolerate the procedure well and without complications. The patient was able to be extubated and transferred to the recovery unit in satisfactory condition.   Pryor Curia MD

## 2019-05-09 NOTE — Plan of Care (Signed)

## 2019-05-09 NOTE — Anesthesia Postprocedure Evaluation (Signed)
Anesthesia Post Note  Patient: Phillip Gates  Procedure(s) Performed: XI ROBOTIC ASSISTED LAPAROSCOPIC RADICAL PROSTATECTOMY LEVEL 3 (N/A ) LYMPHADENECTOMY, PELVIC (Bilateral )     Patient location during evaluation: PACU Anesthesia Type: General Level of consciousness: awake and alert Pain management: pain level controlled Vital Signs Assessment: post-procedure vital signs reviewed and stable Respiratory status: spontaneous breathing, nonlabored ventilation, respiratory function stable and patient connected to nasal cannula oxygen Cardiovascular status: blood pressure returned to baseline and stable Postop Assessment: no apparent nausea or vomiting Anesthetic complications: no    Last Vitals:  Vitals:   05/09/19 1445 05/09/19 1506  BP: 136/73 130/77  Pulse: 74 77  Resp: 12 16  Temp: 37 C 36.8 C  SpO2: 92% 96%    Last Pain:  Vitals:   05/09/19 1506  TempSrc: Oral  PainSc:                  [name]

## 2019-05-10 ENCOUNTER — Encounter (HOSPITAL_COMMUNITY): Payer: Self-pay | Admitting: Urology

## 2019-05-10 DIAGNOSIS — C61 Malignant neoplasm of prostate: Secondary | ICD-10-CM | POA: Diagnosis not present

## 2019-05-10 LAB — HEMOGLOBIN AND HEMATOCRIT, BLOOD
HCT: 39.6 % (ref 39.0–52.0)
HCT: 39.4 % (ref 39.0–52.0)
Hemoglobin: 12.7 g/dL — ABNORMAL LOW (ref 13.0–17.0)
Hemoglobin: 12.9 g/dL — ABNORMAL LOW (ref 13.0–17.0)

## 2019-05-10 MED ORDER — TRAMADOL HCL 50 MG PO TABS
50.0000 mg | ORAL_TABLET | Freq: Four times a day (QID) | ORAL | Status: DC | PRN
  Administered 2019-05-10: 50 mg via ORAL
  Administered 2019-05-10: 100 mg via ORAL
  Filled 2019-05-10: qty 1
  Filled 2019-05-10: qty 2

## 2019-05-10 MED ORDER — BISACODYL 10 MG RE SUPP
10.0000 mg | Freq: Once | RECTAL | Status: AC
  Administered 2019-05-10: 10 mg via RECTAL
  Filled 2019-05-10: qty 1

## 2019-05-10 NOTE — Discharge Summary (Signed)
  Date of admission: 05/09/2019  Date of discharge: 05/10/2019  Admission diagnosis: Prostate Cancer  Discharge diagnosis: Prostate Cancer  History and Physical: For full details, please see admission history and physical. Briefly, Phillip Gates is a 58 y.o. gentleman with localized prostate cancer.  After discussing management/treatment options, he elected to proceed with surgical treatment.  Hospital Course: Phillip Gates was taken to the operating room on 05/09/2019 and underwent a robotic assisted laparoscopic radical prostatectomy. He tolerated this procedure well and without complications. Postoperatively, he was able to be transferred to a regular hospital room following recovery from anesthesia.  He was able to begin ambulating the night of surgery. He remained hemodynamically stable overnight.  He had excellent urine output with appropriately minimal output from his pelvic drain and his pelvic drain was removed on POD #1.  He was transitioned to oral pain medication, tolerated a clear liquid diet, and had met all discharge criteria and was able to be discharged home later on POD#1.  Laboratory values:  Recent Labs    05/09/19 1346 05/10/19 0522 05/10/19 1103  HGB 15.1 12.7* 12.9*  HCT 47.2 39.6 39.4    Disposition: Home  Discharge instruction: He was instructed to be ambulatory but to refrain from heavy lifting, strenuous activity, or driving. He was instructed on urethral catheter care.  Discharge medications:   Allergies as of 05/10/2019   No Known Allergies     Medication List    TAKE these medications   acetaminophen 500 MG tablet Commonly known as: TYLENOL Take 1,000-1,500 mg by mouth every 6 (six) hours as needed for moderate pain or headache.   atorvastatin 40 MG tablet Commonly known as: LIPITOR Take 40 mg by mouth every Sunday.   fluticasone 50 MCG/ACT nasal spray Commonly known as: FLONASE Place 1 spray into both nostrils 2 (two) times daily as needed for  allergies or rhinitis.   omeprazole 20 MG capsule Commonly known as: PRILOSEC Take 20 mg by mouth daily.   Refresh 1.4-0.6 % Soln Generic drug: Polyvinyl Alcohol-Povidone PF Place 1 drop into both eyes 2 (two) times daily.   sildenafil 20 MG tablet Commonly known as: REVATIO Take 60-80 mg by mouth daily as needed (for ED).   sulfamethoxazole-trimethoprim 800-160 MG tablet Commonly known as: BACTRIM DS Take 1 tablet by mouth 2 (two) times daily. Start the day prior to foley removal appointment   tadalafil 20 MG tablet Commonly known as: CIALIS Take 20 mg by mouth daily as needed for erectile dysfunction. Reports alternating Viagra and Cialis   traMADol 50 MG tablet Commonly known as: Ultram Take 1-2 tablets (50-100 mg total) by mouth every 6 (six) hours as needed for moderate pain or severe pain.       Followup: He will followup in 1 week for catheter removal and to discuss his surgical pathology results.

## 2019-05-10 NOTE — Progress Notes (Signed)
Patient ID: Phillip Gates, male   DOB: 1961/03/24, 58 y.o.   MRN: [id number]  1 Day Post-Op Subjective: The patient is doing well.  No nausea or vomiting. Pain is adequately controlled.  Objective: Vital signs in last 24 hours: Temp:  [97.6 F (36.4 C)-99.3 F (37.4 C)] 97.7 F (36.5 C) (08/14 0724) Pulse Rate:  [58-81] 64 (08/14 0724) Resp:  [11-18] 16 (08/14 0724) BP: (116-152)/(66-132) 123/69 (08/14 0724) SpO2:  [92 %-100 %] 93 % (08/14 0724) Weight:  [92.8 kg-94.8 kg] 92.8 kg (08/13 1506)  Intake/Output from previous day: 08/13 0701 - 08/14 0700 In: 4418.7 [P.O.:240; I.V.:2968.7; IV Piggyback:1150] Out: 6160 [Urine:1075; Drains:113; Stool:1; Blood:50] Intake/Output this shift: Total I/O In: -  Out: 550 [Urine:550]  Physical Exam:  General: Alert and oriented. CV: RRR Lungs: Clear bilaterally. GI: Soft, Nondistended. Incisions: Clean, dry, and intact Urine: Clear Extremities: Nontender, no erythema, no edema.  Lab Results: Recent Labs    05/09/19 1346 05/10/19 0522  HGB 15.1 12.7*  HCT 47.2 39.6      Assessment/Plan: POD# 1 s/p robotic prostatectomy.  1) SL IVF 2) Ambulate, Incentive spirometry 3) Transition to oral pain medication 4) Dulcolax suppository 5) D/C pelvic drain 6) Plan for likely discharge later today   Pryor Curia. MD   LOS: 0 days   Dutch Gray 05/10/2019, 7:40 AM

## 2020-04-24 IMAGING — DX PORTABLE CHEST - 1 VIEW
1 series · 1 of 1 positions shown · non-contrast
Comparison: None.

CLINICAL DATA: Elective surgery, desaturation.

EXAM:
PORTABLE CHEST 1 VIEW

[chest ap]
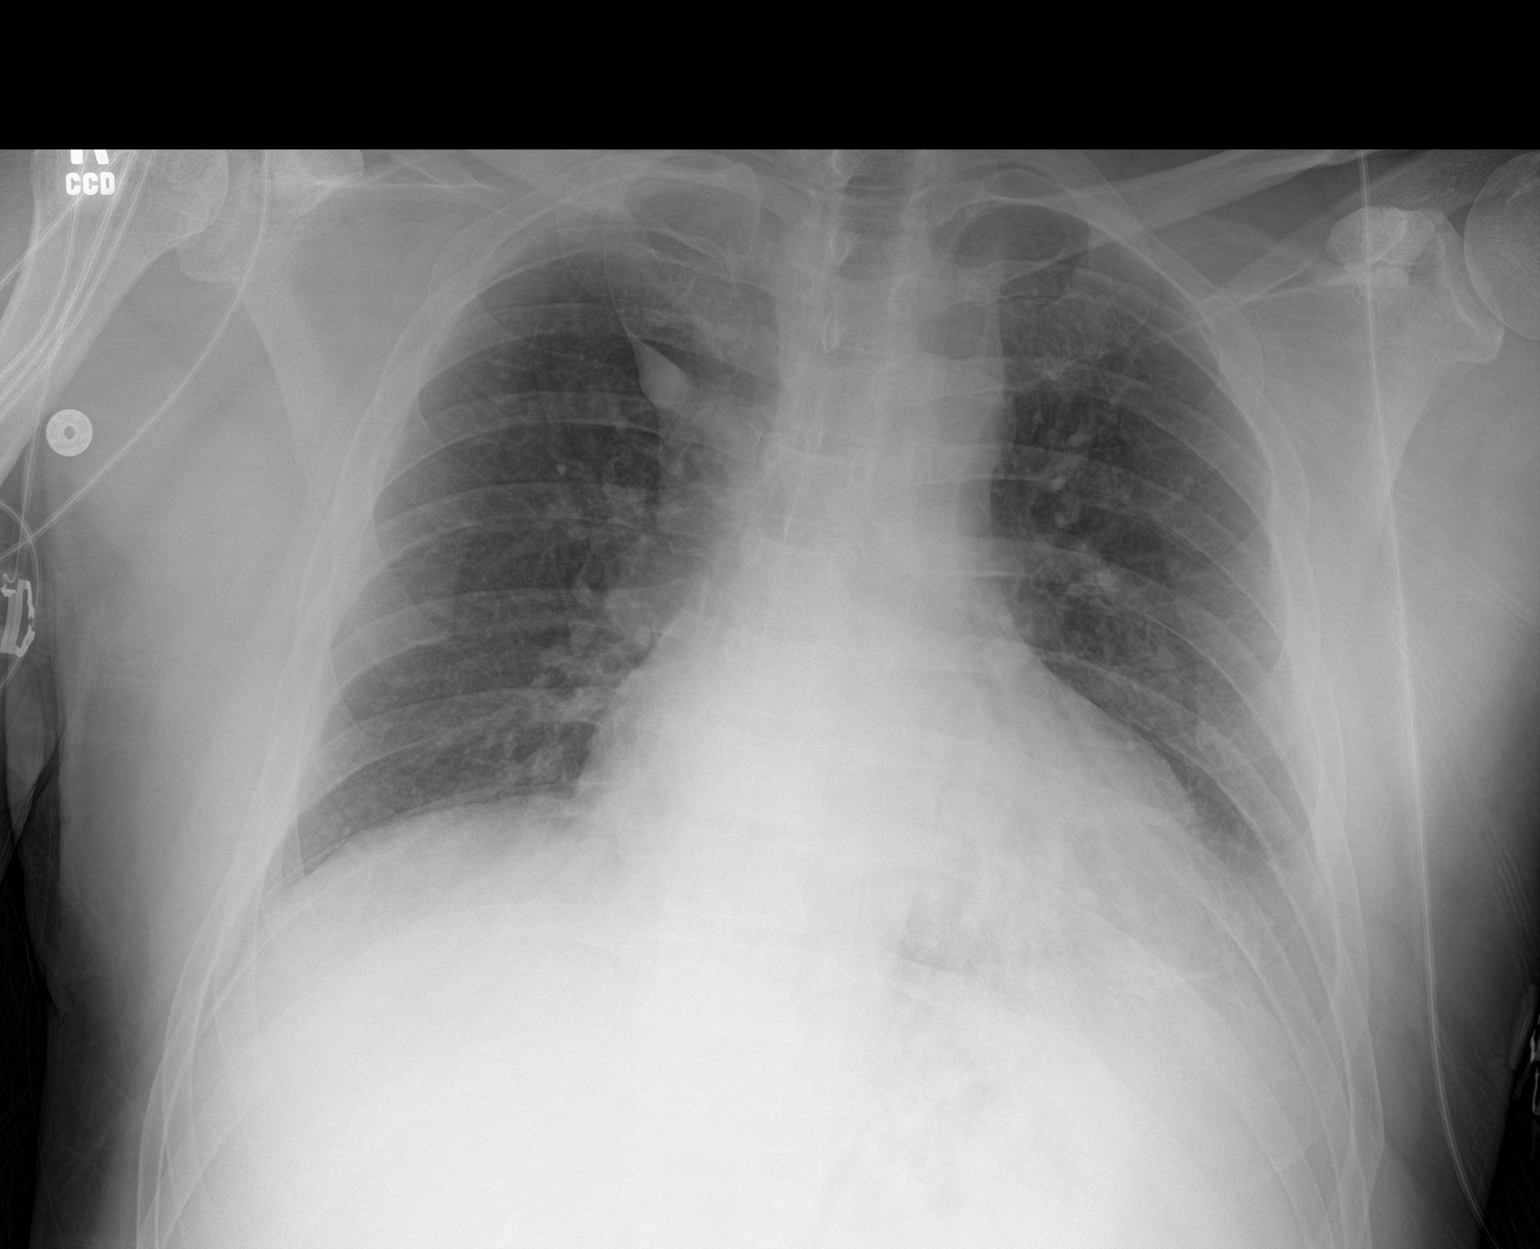

[1 of 1 positions shown; findings below may reference images not displayed]

FINDINGS: The cardiomediastinal contours are normal. Heart size normal for
technique. Incidental azygos fissure. Mild streaky bibasilar
atelectasis. Pulmonary vasculature is normal. No confluent
consolidation, pleural effusion, or pneumothorax. No acute osseous
abnormalities are seen.
IMPRESSION: Mild streaky bibasilar atelectasis.

## 2023-12-15 ENCOUNTER — Other Ambulatory Visit: Payer: Self-pay | Admitting: Urology

## 2024-01-17 ENCOUNTER — Other Ambulatory Visit: Payer: Self-pay

## 2024-01-17 ENCOUNTER — Encounter (HOSPITAL_BASED_OUTPATIENT_CLINIC_OR_DEPARTMENT_OTHER): Payer: Self-pay | Admitting: Urology

## 2024-01-23 MED ORDER — GENTAMICIN SULFATE 40 MG/ML IJ SOLN
5.0000 mg/kg | INTRAVENOUS | Status: AC
  Administered 2024-01-24: 450 mg via INTRAVENOUS
  Filled 2024-01-23 (×2): qty 11.25

## 2024-01-23 MED ORDER — VANCOMYCIN HCL 1500 MG/300ML IV SOLN
1500.0000 mg | INTRAVENOUS | Status: AC
  Administered 2024-01-24: 1500 mg via INTRAVENOUS
  Filled 2024-01-23 (×2): qty 300

## 2024-01-23 MED ORDER — FLUCONAZOLE IN SODIUM CHLORIDE 200-0.9 MG/100ML-% IV SOLN
200.0000 mg | INTRAVENOUS | Status: DC
  Administered 2024-01-24: 200 mg via INTRAVENOUS
  Filled 2024-01-23 (×2): qty 100

## 2024-01-24 ENCOUNTER — Ambulatory Visit (HOSPITAL_BASED_OUTPATIENT_CLINIC_OR_DEPARTMENT_OTHER)
Admission: RE | Admit: 2024-01-24 | Discharge: 2024-01-24 | Disposition: A | Discharge status: Home / Self Care | Attending: Urology | Admitting: Urology

## 2024-01-24 ENCOUNTER — Ambulatory Visit (HOSPITAL_BASED_OUTPATIENT_CLINIC_OR_DEPARTMENT_OTHER): Admitting: Anesthesiology

## 2024-01-24 ENCOUNTER — Encounter (HOSPITAL_BASED_OUTPATIENT_CLINIC_OR_DEPARTMENT_OTHER)
Admission: RE | Disposition: A | Discharge status: Home / Self Care | Payer: Self-pay | Source: Home / Self Care | Attending: Urology

## 2024-01-24 ENCOUNTER — Encounter (HOSPITAL_BASED_OUTPATIENT_CLINIC_OR_DEPARTMENT_OTHER): Payer: Self-pay | Admitting: Urology

## 2024-01-24 ENCOUNTER — Other Ambulatory Visit: Payer: Self-pay

## 2024-01-24 DIAGNOSIS — K219 Gastro-esophageal reflux disease without esophagitis: Secondary | ICD-10-CM | POA: Insufficient documentation

## 2024-01-24 DIAGNOSIS — N528 Other male erectile dysfunction: Secondary | ICD-10-CM | POA: Diagnosis present

## 2024-01-24 DIAGNOSIS — R519 Headache, unspecified: Secondary | ICD-10-CM | POA: Insufficient documentation

## 2024-01-24 DIAGNOSIS — Z79899 Other long term (current) drug therapy: Secondary | ICD-10-CM | POA: Diagnosis not present

## 2024-01-24 DIAGNOSIS — Z7982 Long term (current) use of aspirin: Secondary | ICD-10-CM | POA: Insufficient documentation

## 2024-01-24 DIAGNOSIS — N529 Male erectile dysfunction, unspecified: Secondary | ICD-10-CM

## 2024-01-24 DIAGNOSIS — Z8546 Personal history of malignant neoplasm of prostate: Secondary | ICD-10-CM | POA: Diagnosis not present

## 2024-01-24 DIAGNOSIS — Z01818 Encounter for other preprocedural examination: Secondary | ICD-10-CM

## 2024-01-24 DIAGNOSIS — Z9079 Acquired absence of other genital organ(s): Secondary | ICD-10-CM | POA: Insufficient documentation

## 2024-01-24 HISTORY — DX: Personal history of urinary calculi: Z87.442

## 2024-01-24 HISTORY — PX: PENILE PROSTHESIS IMPLANT: SHX240

## 2024-01-24 SURGERY — INSERTION, PENILE PROSTHESIS
Anesthesia: General

## 2024-01-24 MED ORDER — MUPIROCIN 2 % EX OINT
TOPICAL_OINTMENT | CUTANEOUS | Status: AC
  Filled 2024-01-24: qty 22

## 2024-01-24 MED ORDER — SODIUM CHLORIDE 0.9 % IV SOLN
12.5000 mg | INTRAVENOUS | Status: DC | PRN
  Filled 2024-01-24: qty 0.5

## 2024-01-24 MED ORDER — PROPOFOL 10 MG/ML IV BOLUS
INTRAVENOUS | Status: DC | PRN
  Administered 2024-01-24: 200 mg via INTRAVENOUS

## 2024-01-24 MED ORDER — CHLORHEXIDINE GLUCONATE 4 % EX SOLN: Freq: Once | CUTANEOUS | Status: DC

## 2024-01-24 MED ORDER — LIDOCAINE HCL (PF) 1 % IJ SOLN
INTRAMUSCULAR | Status: DC | PRN
  Administered 2024-01-24: 20 mL via SURGICAL_CAVITY

## 2024-01-24 MED ORDER — MIDAZOLAM HCL 5 MG/5ML IJ SOLN
INTRAMUSCULAR | Status: DC | PRN
  Administered 2024-01-24: 2 mg via INTRAVENOUS

## 2024-01-24 MED ORDER — AMISULPRIDE (ANTIEMETIC) 5 MG/2ML IV SOLN: 10.0000 mg | Freq: Once | INTRAVENOUS | Status: DC | PRN

## 2024-01-24 MED ORDER — MIDAZOLAM HCL 2 MG/2ML IJ SOLN
INTRAMUSCULAR | Status: AC
  Filled 2024-01-24: qty 2

## 2024-01-24 MED ORDER — PHENYLEPHRINE 80 MCG/ML (10ML) SYRINGE FOR IV PUSH (FOR BLOOD PRESSURE SUPPORT)
PREFILLED_SYRINGE | INTRAVENOUS | Status: DC | PRN
  Administered 2024-01-24 (×2): 80 ug via INTRAVENOUS

## 2024-01-24 MED ORDER — MEPERIDINE HCL 25 MG/ML IJ SOLN: 6.2500 mg | INTRAMUSCULAR | Status: DC | PRN

## 2024-01-24 MED ORDER — OXYCODONE HCL 5 MG PO TABS
ORAL_TABLET | ORAL | Status: AC
  Filled 2024-01-24: qty 1

## 2024-01-24 MED ORDER — HYDROMORPHONE HCL 1 MG/ML IJ SOLN
0.2500 mg | INTRAMUSCULAR | Status: DC | PRN
  Administered 2024-01-24 (×2): 0.25 mg via INTRAVENOUS

## 2024-01-24 MED ORDER — IRRISEPT - 450ML BOTTLE WITH 0.05% CHG IN STERILE WATER, USP 99.95% OPTIME
TOPICAL | Status: DC | PRN
  Administered 2024-01-24: 1350 mL

## 2024-01-24 MED ORDER — DEXAMETHASONE SODIUM PHOSPHATE 10 MG/ML IJ SOLN
INTRAMUSCULAR | Status: AC
  Filled 2024-01-24: qty 1

## 2024-01-24 MED ORDER — MUPIROCIN 2 % EX OINT
1.0000 | TOPICAL_OINTMENT | Freq: Once | CUTANEOUS | Status: AC
  Administered 2024-01-24: 1 via NASAL

## 2024-01-24 MED ORDER — OXYCODONE HCL 5 MG/5ML PO SOLN: 5.0000 mg | Freq: Once | ORAL | Status: AC | PRN

## 2024-01-24 MED ORDER — FENTANYL CITRATE (PF) 100 MCG/2ML IJ SOLN
INTRAMUSCULAR | Status: AC
  Filled 2024-01-24: qty 2

## 2024-01-24 MED ORDER — LACTATED RINGERS IV SOLN: INTRAVENOUS | Status: DC

## 2024-01-24 MED ORDER — FENTANYL CITRATE (PF) 100 MCG/2ML IJ SOLN
INTRAMUSCULAR | Status: DC | PRN
  Administered 2024-01-24 (×2): 50 ug via INTRAVENOUS

## 2024-01-24 MED ORDER — OXYCODONE HCL 5 MG PO TABS
5.0000 mg | ORAL_TABLET | Freq: Once | ORAL | Status: AC | PRN
  Administered 2024-01-24: 5 mg via ORAL

## 2024-01-24 MED ORDER — SODIUM CHLORIDE 0.9 % IR SOLN
Status: DC | PRN
  Administered 2024-01-24: 1000 mL

## 2024-01-24 MED ORDER — ONDANSETRON HCL 4 MG/2ML IJ SOLN
INTRAMUSCULAR | Status: AC
  Filled 2024-01-24: qty 2

## 2024-01-24 MED ORDER — LIDOCAINE 2% (20 MG/ML) 5 ML SYRINGE
INTRAMUSCULAR | Status: DC | PRN
  Administered 2024-01-24: 60 mg via INTRAVENOUS

## 2024-01-24 MED ORDER — HYDROMORPHONE HCL 1 MG/ML IJ SOLN
INTRAMUSCULAR | Status: AC
  Filled 2024-01-24: qty 0.5

## 2024-01-24 MED ORDER — PHENYLEPHRINE 80 MCG/ML (10ML) SYRINGE FOR IV PUSH (FOR BLOOD PRESSURE SUPPORT)
PREFILLED_SYRINGE | INTRAVENOUS | Status: AC
  Filled 2024-01-24: qty 10

## 2024-01-24 MED ORDER — LIDOCAINE 2% (20 MG/ML) 5 ML SYRINGE
INTRAMUSCULAR | Status: AC
  Filled 2024-01-24: qty 5

## 2024-01-24 SURGICAL SUPPLY — 48 items
BAG URINE DRAIN 2000ML AR STRL (UROLOGICAL SUPPLIES) IMPLANT
BLADE SURG 15 STRL LF DISP TIS (BLADE) ×1 IMPLANT
BNDG GAUZE DERMACEA FLUFF 4 (GAUZE/BANDAGES/DRESSINGS) ×1 IMPLANT
BRIEF MESH DISP 2XL (UNDERPADS AND DIAPERS) ×1 IMPLANT
CATH COUDE 5CC RIBBED (CATHETERS) ×1 IMPLANT
CHLORAPREP W/TINT 26 (MISCELLANEOUS) ×2 IMPLANT
COVER BACK TABLE 60X90IN (DRAPES) ×1 IMPLANT
COVER MAYO STAND STRL (DRAPES) ×2 IMPLANT
DERMABOND ADVANCED .7 DNX12 (GAUZE/BANDAGES/DRESSINGS) ×1 IMPLANT
DRAIN CHANNEL 10F 3/8 F FF (DRAIN) ×1 IMPLANT
DRAPE INCISE IOBAN 66X45 STRL (DRAPES) ×1 IMPLANT
DRAPE LAPAROTOMY 100X72 PEDS (DRAPES) ×1 IMPLANT
DRAPE UTILITY XL STRL (DRAPES) ×1 IMPLANT
DRSG TEGADERM 4X4.75 (GAUZE/BANDAGES/DRESSINGS) ×1 IMPLANT
EVACUATOR SILICONE 100CC (DRAIN) ×1 IMPLANT
GAUZE 4X4 16PLY ~~LOC~~+RFID DBL (SPONGE) IMPLANT
GAUZE SPONGE 4X4 12PLY STRL (GAUZE/BANDAGES/DRESSINGS) ×1 IMPLANT
GLOVE BIO SURGEON STRL SZ7 (GLOVE) ×1 IMPLANT
GLOVE BIOGEL PI IND STRL 7.0 (GLOVE) ×1 IMPLANT
GOWN STRL REUS W/TWL LRG LVL3 (GOWN DISPOSABLE) ×1 IMPLANT
HOLDER FOLEY CATH W/STRAP (MISCELLANEOUS) IMPLANT
IMPL RTE SNAPCONE 0.5CM (Urological Implant) IMPLANT
KIT PENILE TENACIO ACCESSORY (KITS) IMPLANT
LAVAGE JET IRRISEPT WOUND (IRRIGATION / IRRIGATOR) ×2 IMPLANT
NDL HYPO 22X1.5 SAFETY MO (MISCELLANEOUS) ×1 IMPLANT
NEEDLE HYPO 22X1.5 SAFETY MO (MISCELLANEOUS) ×1 IMPLANT
PACK BASIN DAY SURGERY FS (CUSTOM PROCEDURE TRAY) ×1 IMPLANT
PENCIL SMOKE EVACUATOR (MISCELLANEOUS) ×1 IMPLANT
PLUG CATH AND CAP STRL 200 (CATHETERS) ×1 IMPLANT
PUMP PRECONNECT MS 18 LGX (Miscellaneous) IMPLANT
RESERVOIR FLAT IZ 100ML (Miscellaneous) IMPLANT
RETRACTOR DEEP SCROTAL PENILE (MISCELLANEOUS) IMPLANT
RETRACTOR WILSON SYSTEM (INSTRUMENTS) IMPLANT
SET COLLECT BLD 21X.75 12 PB G (NEEDLE) ×1 IMPLANT
SLEEVE SCD COMPRESS KNEE MED (STOCKING) ×1 IMPLANT
SUT ETHILON 3 0 PS 1 (SUTURE) ×1 IMPLANT
SUT MNCRL AB 4-0 PS2 18 (SUTURE) ×1 IMPLANT
SUT VIC AB 2-0 UR6 27 (SUTURE) ×4 IMPLANT
SUT VIC AB 3-0 SH 27X BRD (SUTURE) ×2 IMPLANT
SYR 10ML LL (SYRINGE) ×3 IMPLANT
SYR 50ML LL SCALE MARK (SYRINGE) ×3 IMPLANT
SYR BULB IRRIG 60ML STRL (SYRINGE) ×1 IMPLANT
SYR CONTROL 10ML LL (SYRINGE) ×1 IMPLANT
TOWEL GREEN STERILE FF (TOWEL DISPOSABLE) ×2 IMPLANT
TRAY DSU PREP LF (CUSTOM PROCEDURE TRAY) ×1 IMPLANT
TUBE CONNECTING 20X1/4 (TUBING) ×1 IMPLANT
WATER STERILE IRR 1000ML POUR (IV SOLUTION) ×1 IMPLANT
YANKAUER SUCT BULB TIP NO VENT (SUCTIONS) ×1 IMPLANT

## 2024-01-24 NOTE — Discharge Instructions (Addendum)
 Penile prosthesis postoperative instructions  Wound:  In most cases your incision will have absorbable sutures that will dissolve within the first 10-20 days. Some will fall out even earlier. Expect some redness as the sutures dissolved but this should occur only around the sutures. If there is generalized redness, especially with increasing pain or swelling, let us know. The scrotum and penis will very likely get "black and blue" as the blood in the tissues spread. Sometimes the whole scrotum will turn colors. The black and blue is followed by a yellow and brown color. In time, all the discoloration will go away. In some cases some firm swelling in the area of the testicle and pump may persist for up to 4-6 weeks after the surgery and is considered normal in most cases.  Drain:   You may be discharged home with a drain in place. If so, you will be taught how to empty it and should keep track of the output. Additionally, you should call the office to arrange for an appointment to have it removed after a few days.   Diet:  You may return to your normal diet within 24 hours following your surgery. You may note some mild nausea and possibly vomiting the first 6-8 hours following surgery. This is usually due to the side effects of anesthesia, and will disappear quite soon. I would suggest clear liquids and a very light meal the first evening following your surgery.  Activity:  Your physical activity should be restricted the first 48 hours. During that time you should remain relatively inactive, moving about only when necessary. During the first 3 weeks following surgery you should avoid lifting any heavy objects (anything greater than 15 pounds), and avoid strenuous exercise. If you work, ask [place] specifically about your restrictions, both for work and home. We will write a note to your employer if needed.  Avoid using your penis until your follow up visit with Dr Lafonda Mosses, which will typically be around  3-4 weeks following the surgery. Most people are able to start cycling their device after that appointment, and can have intercourse soon thereafter.   You should plan to wear a tight pair of jockey shorts or an athletic supporter for the first 4-5 days, even to sleep. This will keep the scrotum immobilized to some degree and keep the swelling down.The position of your penis will determine what is most comfortable but I strongly urge you to keep the penis in the "up" position (toward your head). You should continue to tuck "up" your penis when possible for the first 3 months following surgery.  Ice packs should be placed on and off over the scrotum for the first 48 hours. Frozen peas or corn in a ZipLock bag can be frozen, used and re-frozen. Fifteen minutes on and 15 minutes off is a reasonable schedule. The ice is a good pain reliever and keeps the swelling down.  Hygiene:  You may shower 48 hours after your surgery. Tub bathing should be restricted until the wound is completely healed, typically around 2-3 weeks.  Medication:  You will be sent home with some type of pain medication. In many cases you will be sent home with a strong anti-inflammatory medication (Celebrex, Meloxicam) and a narcotic pain pill (hydrocodone or oxycodone). You can also supplement these medications with tylenol (acetaminophen). If the pain medication you are sent home with does not control the pain, please notify the office Problems you should report to [place]:  Fever of 101.0 degrees  Fahrenheit or greater. Moderate or severe swelling under the skin incision or involving the scrotum. Drug reaction such as hives, a rash, nausea or vomiting.    Post Anesthesia Home Care Instructions  Activity: Get plenty of rest for the remainder of the day. A responsible individual must stay with you for 24 hours following the procedure.  For the next 24 hours, DO NOT: -Drive a car -Advertising copywriter -Drink alcoholic  beverages -Take any medication unless instructed by your physician -Make any legal decisions or sign important papers.  Meals: Start with liquid foods such as gelatin or soup. Progress to regular foods as tolerated. Avoid greasy, spicy, heavy foods. If nausea and/or vomiting occur, drink only clear liquids until the nausea and/or vomiting subsides. Call your physician if vomiting continues.  Special Instructions/Symptoms: Your throat may feel dry or sore from the anesthesia or the breathing tube placed in your throat during surgery. If this causes discomfort, gargle with warm salt water. The discomfort should disappear within 24 hours.  If you had a scopolamine patch placed behind your ear for the management of post- operative nausea and/or vomiting:  1. The medication in the patch is effective for 72 hours, after which it should be removed.  Wrap patch in a tissue and discard in the trash. Wash hands thoroughly with soap and water. 2. You may remove the patch earlier than 72 hours if you experience unpleasant side effects which may include dry mouth, dizziness or visual disturbances. 3. Avoid touching the patch. Wash your hands with soap and water after contact with the patch.

## 2024-01-24 NOTE — Transfer of Care (Signed)
 Immediate Anesthesia Transfer of Care Note  Patient: Phillip Gates  Procedure(s) Performed: INSERTION, PENILE PROSTHESIS  Patient Location: PACU  Anesthesia Type:General  Level of Consciousness: awake and patient cooperative  Airway & Oxygen Therapy: Patient Spontanous Breathing and Patient connected to face mask oxygen  Post-op Assessment: Report given to RN and Post -op Vital signs reviewed and stable  Post vital signs: Reviewed and stable  Last Vitals:  Vitals Value Taken Time  BP 123/81 01/24/24 1233  Temp 36.2 C 01/24/24 1233  Pulse 64 01/24/24 1235  Resp 11 01/24/24 1235  SpO2 96 % 01/24/24 1235  Vitals shown include unfiled device data.  Last Pain:  Vitals:   01/24/24 0719  TempSrc: Tympanic  PainSc: 0-No pain      Patients Stated Pain Goal: 7 (01/24/24 0719)  Complications: No notable events documented.

## 2024-01-24 NOTE — H&P (Signed)
 H&P  History of Present Illness: Phillip Gates is a 63 y.o. year old M who presents today for insertion of an inflatable penile prosthesis  No acute complaints  Past Medical History:  Diagnosis Date   Barrett esophagus 2014   Dr. Marcelina Sequin - F/U in 3 years    Diverticulitis    Esophageal reflux    Gastric polyps 2014   GERD (gastroesophageal reflux disease)    History of kidney stones    Internal hemorrhoids 2013   Dr. Marcelina Sequin - F/U in 10 years   Problems with sexual function    Prostate cancer [facility])    Special screening examination for unspecified viral disease     Past Surgical History:  Procedure Laterality Date   APPENDECTOMY     CHOLECYSTECTOMY     COLECTOMY  approx  2006   colon resection    HERNIA REPAIR     right inguinal hernia repair    KIDNEY STONE SURGERY     lithotripsy    LYMPHADENECTOMY Bilateral 05/09/2019   Procedure: LYMPHADENECTOMY, PELVIC;  Surgeon: Florencio Hunting, MD;  Location: [facility];  Service: Urology;  Laterality: Bilateral;   PROSTATE BIOPSY     ROBOT ASSISTED LAPAROSCOPIC RADICAL PROSTATECTOMY N/A 05/09/2019   Procedure: XI ROBOTIC ASSISTED LAPAROSCOPIC RADICAL PROSTATECTOMY LEVEL 3;  Surgeon: Florencio Hunting, MD;  Location: [facility];  Service: Urology;  Laterality: N/A;  ONLY NEEDS 210 MIN FOR ALL PROCEDURES    Home Medications:  Current Meds  Medication Sig   acetaminophen  (TYLENOL ) 500 MG tablet Take 1,000-1,500 mg by mouth every 6 (six) hours as needed for moderate pain or headache.   aspirin EC 81 MG tablet Take 81 mg by mouth daily. Swallow whole.   famotidine (PEPCID) 20 MG tablet Take 20 mg by mouth 2 (two) times daily.   fluticasone  (FLONASE ) 50 MCG/ACT nasal spray Place 1 spray into both nostrils 2 (two) times daily as needed for allergies or rhinitis.    levocetirizine (XYZAL) 5 MG tablet Take 5 mg by mouth every evening.   omeprazole  (PRILOSEC) 20 MG capsule Take 20 mg by mouth daily.   Polyvinyl Alcohol -Povidone PF (REFRESH) 1.4-0.6  % SOLN Place 1 drop into both eyes 2 (two) times daily.   rosuvastatin (CRESTOR) 5 MG tablet Take 5 mg by mouth daily.   [DISCONTINUED] cetirizine (ZYRTEC) 10 MG tablet Take 10 mg by mouth daily.    Allergies: No Known Allergies  Family History  Problem Relation Age of Onset   Heart disease Father        Pacemaker   CVA Father    Prostate cancer Brother    Breast cancer Neg Hx    Colon cancer Neg Hx     Social History:  reports that he has never smoked. He has never used smokeless tobacco. He reports current alcohol  use of about 0.5 standard drinks of alcohol  per week. He reports that he does not use drugs.  ROS: A complete review of systems was performed.  All systems are negative except for pertinent findings as noted.  Physical Exam:  Vital signs in last 24 hours: Temp:  [97.2 F (36.2 C)] 97.2 F (36.2 C) (04/30 0719) Pulse Rate:  [59] 59 (04/30 0719) Resp:  [20] 20 (04/30 0719) BP: (159)/(91) 159/91 (04/30 0719) SpO2:  [98 %] 98 % (04/30 0719) Weight:  [89 kg] 89 kg (04/30 0719) Constitutional:  Alert and oriented, No acute distress Cardiovascular: Regular rate and rhythm Respiratory: Normal respiratory effort, Lungs clear bilaterally  GI: Abdomen is soft, nontender, nondistended, no abdominal masses Lymphatic: No lymphadenopathy Neurologic: Grossly intact, no focal deficits Psychiatric: Normal mood and affect   Laboratory Data:  No results for input(s): "WBC", "HGB", "HCT", "PLT" in the last 72 hours.  No results for input(s): "NA", "K", "CL", "GLUCOSE", "BUN", "CALCIUM ", "CREATININE" in the last 72 hours.  Invalid input(s): "CO3"   No results found for this or any previous visit (from the past 24 hours). No results found for this or any previous visit (from the past 240 hours).  Renal Function: No results for input(s): "CREATININE" in the last 168 hours. CrCl cannot be calculated (Patient's most recent lab result is older than the maximum 21 days  allowed.).  Radiologic Imaging: No results found.  Assessment:  Phillip Gates is a 63 y.o. year old M with ED refractory to other medical treatments  Plan:  To OR as planned for IPP. Procedure and risks reviewed, including but not limited to bleeding, infection, implant infection, implant malfunction, implant malplacement, erosion, damage to adjacent structures, pain, urinary retention. All questions answered   [name], MD 01/24/2024, 9:46 AM  [facility] Pager: [phone]

## 2024-01-24 NOTE — Op Note (Signed)
 PATIENT:  Phillip Gates  PRE-OPERATIVE DIAGNOSIS:  Organic erectile dysfunction  POST-OPERATIVE DIAGNOSIS:  Same  PROCEDURE:   3 piece inflatable penile prosthesis (BS/AMS) Injection of pharmacoagent into penis  SURGEON:  [name] MD  ASST: Alphonza Ashing, MD  INDICATION: He has had long-standing organic erectile dysfunction and refractory to other modes of treatment. He has elected to proceed with prosthesis implantation.  ANESTHESIA:  General  EBL:  50 cc  Device: 3 piece AMS LGX 700: 94 cc reservoir, 18 cm cylinders and 0.5 cm rear-tip extenders on right and left sides  LOCAL MEDICATIONS USED:   penile block done with 10 cc lidocaine /marcaine  mixture 10 cc injected into corpora directly via butterfly needle  SPECIMEN: None  Description of procedure: The patient was taken to the major operating room, placed on the table and administered general anesthesia in the supine position. His genitalia was then prepped with chlorhexidine  x 2. He was draped in the usual sterile fashion, and I used [place] on the field. An official timeout was then performed.  A dorsal penile block was performed. A butterfly needle was then used to inject normal saline into the penis to give an artificial erection. There was no clinically significant curvature or deformity. I then injected 10 cc of lidocaine /marcaine  into the penis.   A 14 French coude catheter was then placed in the bladder and the bladder was drained and the catheter was plugged. A midline penoscrotal incision was then made and the dissection was carried down to the corpora and urethra. The lonestar retractor was positioned so as to have excellent exposure. 2-0 Vicryl sutures were then placed proximally in each corpus cavernosum to serve as stay sutures. An incision was then made in the corpus cavernosum first on the left-hand side with the bovie. [place] were used to gently dilate the opening. I then dilated the corpus cavernosum with the a 12 Fr  brooks dilator distally and proximally. Field goal post tests were performed and there was no evidence of perforations or crossover. I then irrigated the corpus cavernosum with antibiotic solution and measured the distance proximally and distally from the stay suture and was found to be 7 and 11.5 cm, respectively.I then turned my attention to the contralateral corpus cavernosum and placed my stay sutures, made my corporotomy and dilated the corpus cavernosum in an identical fashion. This was measured and also was found to be 7 cm proximally and 11.5 distally. It was irrigated with anastomotic solution as was the scrotum. I then chose an 18 cm cylinder set with 0.5 cm rear-tip extenders and these were prepped while I prepared the site for reservoir placement.  I then digitally probed into the Left external inguinal ring. My finger was used to dissect open the conjoint tendon I used my finger to ensure I was in the appropriate space, and to clear room for the reservoir. I further dissected the space cephalad the a sponge stick I irrigated the space with anastomotic solution and then placed the reservoir in this location. I then filled the reservoir with 94 cc of sterile saline, and checked to confirm proper position. There was minimal backpressure with the reservoir max-filled.  Attention was redirected to the corporotomies where the cylinders were then placed by first fixing the suture to the distal aspect of the right cylinder to a straight needle. This was then loaded on the [facility] inserter and passed through the corporotomy and distally. I then advanced the straight needle with the [facility] inserter out through  the glans and this was grasped with a hemostat and pulled through the glans and the suture was secured with a hemostat. I then performed an identical maneuver on the contralateral side. After this was performed I irrigated both corpus cavernosum; there was no evidence of urethral perforation. I inserted  the distal portion of the cylinder through the corporotomies and pulled this to the end of the corpora with the suture. The proximal aspect with the rear-tip extender was then passed through the corporotomy and into the seated position on each side. I then connected reservoir tubing to a syringe filled with sterile saline and inflated the device. I noted a good straight erection; there was some assymetry so I elected to remove the implant and replace as it was possibly from an over and back crossover in the corpora. I then repassed the implant in a similar fashion, and test inflated. This time, there was a good, symmetric injection with good engorgement. I therefore deflated the device and closed the corporotomies with used my previously placed stay sutures.   I then grasped the scrotal skin in the midline with a babcock, and used a hemostat to dissect down to the dependent-most portion of the scrotum. The nasal speculum was inserted into this space, and facilitated placement of the pump. The cylinder was then connected to the pump after excising the excess tubing with appropriate shodded hemostats in place and then I used the supplied connectors to make the connection. I then again cycled the device with the pump and it cycled properly. I deflated the device and pumped it up about three quarters of the way to aid with hemostasis. I irrigated the wound one last time with antibiotic irrigation and then closed the deep scrotal tissue over the tubing and pump with running 3-0 monocryl suture. I placed a 10 Fr blake drain over the corporotomies. A second layer was then closed over this first layer with running 3- 0 monocryl, and running skin suture w/ 4-0 monocryl performed. Incision dressed with dermabond.  A mummy wrap was applied. The catheter was removed, and drain connected to suction bulb and the patient was awakened and taken recovery room in stable and satisfactory condition. He tolerated the procedure well  and there were no intraoperative complications. Needle sponge and instrument counts were correct at the end of the operation.

## 2024-01-24 NOTE — Anesthesia Procedure Notes (Signed)
 Procedure Name: LMA Insertion Date/Time: 01/24/2024 10:38 AM  Performed by: Darcel Early, CRNAPre-anesthesia Checklist: Patient identified, Emergency Drugs available, Suction available, Patient being monitored and Timeout performed Patient Re-evaluated:Patient Re-evaluated prior to induction Oxygen Delivery Method: Circle system utilized Preoxygenation: Pre-oxygenation with 100% oxygen Induction Type: IV induction Ventilation: Mask ventilation without difficulty LMA: LMA with gastric port inserted LMA Size: 4.0 Number of attempts: 1 Placement Confirmation: positive ETCO2 Dental Injury: Teeth and Oropharynx as per pre-operative assessment

## 2024-01-24 NOTE — Anesthesia Postprocedure Evaluation (Signed)
 Anesthesia Post Note  Patient: Phillip Gates  Procedure(s) Performed: INSERTION, PENILE PROSTHESIS     Patient location during evaluation: PACU Anesthesia Type: General Level of consciousness: awake and alert Pain management: pain level controlled Vital Signs Assessment: post-procedure vital signs reviewed and stable Respiratory status: spontaneous breathing, nonlabored ventilation and respiratory function stable Cardiovascular status: blood pressure returned to baseline and stable Postop Assessment: no apparent nausea or vomiting Anesthetic complications: no   No notable events documented.  Last Vitals:  Vitals:   01/24/24 1257 01/24/24 1314  BP: 130/84 134/73  Pulse: 65 64  Resp: 18 18  Temp:  (!) 36.3 C  SpO2: 94% 98%    Last Pain:  Vitals:   01/24/24 1334  TempSrc:   PainSc: 5                  [name]

## 2024-01-24 NOTE — Anesthesia Preprocedure Evaluation (Signed)
 Anesthesia Evaluation  Patient identified by MRN, date of birth, ID band Patient awake    Reviewed: Allergy & Precautions, NPO status , Patient's Chart, lab work & pertinent test results  Airway Mallampati: II  TM Distance: >3 FB Neck ROM: Full    Dental no notable dental hx.    Pulmonary neg pulmonary ROS   Pulmonary exam normal breath sounds clear to auscultation       Cardiovascular negative cardio ROS Normal cardiovascular exam Rhythm:Regular Rate:Normal     Neuro/Psych  Headaches  negative psych ROS   GI/Hepatic Neg liver ROS,GERD  Medicated,,  Endo/Other  negative endocrine ROS    Renal/GU negative Renal ROS  negative genitourinary   Musculoskeletal negative musculoskeletal ROS (+)    Abdominal   Peds negative pediatric ROS (+)  Hematology negative hematology ROS (+)   Anesthesia Other Findings   Reproductive/Obstetrics negative OB ROS                             Anesthesia Physical Anesthesia Plan  ASA: II  Anesthesia Plan: General   Post-op Pain Management:    Induction: Intravenous  PONV Risk Score and Plan: 2 and Ondansetron , Treatment may vary due to age or medical condition and Midazolam   Airway Management Planned: LMA  Additional Equipment:   Intra-op Plan:   Post-operative Plan: Extubation in OR  Informed Consent: I have reviewed the patients History and Physical, chart, labs and discussed the procedure including the risks, benefits and alternatives for the proposed anesthesia with the patient or authorized representative who has indicated his/her understanding and acceptance.     Dental advisory given  Plan Discussed with: CRNA and Surgeon  Anesthesia Plan Comments:         Anesthesia Quick Evaluation

## 2024-01-30 ENCOUNTER — Encounter (HOSPITAL_BASED_OUTPATIENT_CLINIC_OR_DEPARTMENT_OTHER): Payer: Self-pay | Admitting: Urology
